# Patient Record
Sex: Female | Born: 1990
Health system: Southern US, Community
[De-identification: ages and names within clinical notes are randomized; demographics above are authoritative.]

## PROBLEM LIST (undated history)

## (undated) HISTORY — PX: TONSILLECTOMY: SUR1361

---
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
## 2012-03-26 ENCOUNTER — Ambulatory Visit: Payer: Self-pay | Admitting: Neurology

## 2012-11-27 DIAGNOSIS — D500 Iron deficiency anemia secondary to blood loss (chronic): Secondary | ICD-10-CM | POA: Insufficient documentation

## 2012-11-27 HISTORY — DX: Iron deficiency anemia secondary to blood loss (chronic): D50.0

## 2014-06-09 DIAGNOSIS — G44219 Episodic tension-type headache, not intractable: Secondary | ICD-10-CM | POA: Insufficient documentation

## 2014-06-09 HISTORY — DX: Episodic tension-type headache, not intractable: G44.219

## 2015-07-28 ENCOUNTER — Ambulatory Visit (INDEPENDENT_AMBULATORY_CARE_PROVIDER_SITE_OTHER): Payer: BLUE CROSS/BLUE SHIELD | Admitting: Podiatry

## 2015-07-28 ENCOUNTER — Encounter: Payer: Self-pay | Admitting: Podiatry

## 2015-07-28 ENCOUNTER — Ambulatory Visit (INDEPENDENT_AMBULATORY_CARE_PROVIDER_SITE_OTHER): Payer: BLUE CROSS/BLUE SHIELD

## 2015-07-28 VITALS — BP 109/79 | HR 88 | Resp 16 | Ht 67.0 in | Wt 185.0 lb

## 2015-07-28 DIAGNOSIS — M79671 Pain in right foot: Secondary | ICD-10-CM

## 2015-07-28 DIAGNOSIS — M79672 Pain in left foot: Secondary | ICD-10-CM

## 2015-07-28 DIAGNOSIS — Q828 Other specified congenital malformations of skin: Secondary | ICD-10-CM | POA: Diagnosis not present

## 2015-07-28 DIAGNOSIS — M216X9 Other acquired deformities of unspecified foot: Secondary | ICD-10-CM

## 2015-07-28 DIAGNOSIS — M722 Plantar fascial fibromatosis: Secondary | ICD-10-CM | POA: Diagnosis not present

## 2015-07-28 NOTE — Patient Instructions (Signed)

## 2015-07-28 NOTE — Progress Notes (Signed)
   Subjective:    Patient ID: Sonia Webb, female    DOB: [birth year], 25 y.o.   MRN: [id number]  HPI Chief Complaint  Patient presents with  . Foot Pain    Bilateral; heel & arch; pt stated, "foot hurts more at night"; x2 months  . Painful lesion    Left foot; plantar forefoot-below great toe   25 year old female presents the office today for concerns of bilateral heel pain which is been ongoing about 2 months. She states that she has pain in the morning when she gets up and after standing all day. She denies any recent treatment. Denies any recent injury or trauma. No swelling or redness. No numbness or tingling. Pain does not wake her at night. She also has a callus in the left foot in the ball of her foot which is painful at times. Denies any drainage or redness. No other complete a this time and no other concerns.   Review of Systems  All other systems reviewed and are negative.      Objective:   Physical Exam General: AAO x3, NAD  Dermatological: Hyperkeratotic lesion left foot some metatarsal 1. No underlying ulceration, drainage or other signs of infection. No other open lesions or pre-ulcerative lesions are identified at this time.  Vascular: Dorsalis Pedis artery and Posterior Tibial artery pedal pulses are 2/4 bilateral with immedate capillary fill time. Pedal hair growth present. No varicosities and no lower extremity edema present bilateral. There is no pain with calf compression, swelling, warmth, erythema.   Neruologic: Grossly intact via light touch bilateral. Vibratory intact via tuning fork bilateral. Protective threshold with Semmes Wienstein monofilament intact to all pedal sites bilateral.   Musculoskeletal: Tenderness to palpation along the plantar medial tubercle of the calcaneus at the insertion of plantar fascia on the left and right foot. There is no pain along the course of the plantar fascia within the arch of the foot. Plantar fascia appears to be  intact. There is no pain with lateral compression of the calcaneus or pain with vibratory sensation. There is no pain along the course or insertion of the achilles tendon. No other areas of tenderness to bilateral lower extremities. Cavus foot type is present.  Gait: Unassisted, Nonantalgic.       Assessment & Plan:  25 year old female bilateral heel pain,plantar fasciitis due to biomechanical changes; left foot hyperkeratotic lesion -Treatment options discussed including all alternatives, risks, and complications -Etiology of symptoms were discussed -X-rays were obtained and reviewed with the patient. No evidence of acute fractures identified. -Patient elects to proceed with steroid injection into the left and right heel. Under sterile skin preparation, a total of 2.5cc of kenalog 10, 0.5% Marcaine plain, and 2% lidocaine plain were infiltrated into the symptomatic area without complication. A band-aid was applied. Patient tolerated the injection well without complication. Post-injection care with discussed with the patient. Discussed with the patient to ice the area over the next couple of days to help prevent a steroid flare.  -Plantar fascial braces -She cannot take anti-inflammatories -Continue stretching, icing exercises daily. -Given cavus foot type with likely and affect from orthotics. However given insurance she'll start with over-the-counter. -Discussed shoe gear modifications -Hyperkeratotic lesion debrided 1 without complications or bleeding -Follow-up in 3 weeks or sooner if any problems arise. In the meantime, encouraged to call the office with any questions, concerns, change in symptoms.   Celesta Gentile, DPM

## 2015-08-25 ENCOUNTER — Ambulatory Visit: Payer: BLUE CROSS/BLUE SHIELD | Admitting: Podiatry

## 2015-09-13 ENCOUNTER — Encounter: Payer: Self-pay | Admitting: Podiatry

## 2015-09-13 ENCOUNTER — Ambulatory Visit (INDEPENDENT_AMBULATORY_CARE_PROVIDER_SITE_OTHER): Payer: BLUE CROSS/BLUE SHIELD | Admitting: Podiatry

## 2015-09-13 DIAGNOSIS — M722 Plantar fascial fibromatosis: Secondary | ICD-10-CM | POA: Diagnosis not present

## 2015-09-13 DIAGNOSIS — B078 Other viral warts: Secondary | ICD-10-CM

## 2015-09-13 DIAGNOSIS — B079 Viral wart, unspecified: Secondary | ICD-10-CM | POA: Diagnosis not present

## 2015-09-13 NOTE — Progress Notes (Signed)
Subjective: BLAIZE KIGER presents to the office today for follow-up evaluation of bilateral heel pain. She states that she's having no pain in the left side but she still been some pain of the right foot all does have improved. She's been stretching icing daily. The plantar fascial braces seem to be too tight. She has changed shoes since last appointment as well. She states the lesion to the bottom of her left big toe continues with this as improved as well. Denies any drainage or redness or any swelling.  No other complaints at this time. No acute changes since last appointment. They deny any systemic complaints such as fevers, chills, nausea, vomiting.  Objective: General: AAO x3, NAD  Dermatological: The plantar aspect of the left foot is a hyperkeratotic lesion. Upon debridement there is pinpoint bleeding evidence of verruca. No underlying ulceration, drainage or other signs of infection. No foreign body. No other open lesions or pre-ulcerative lesions. No other evidence of verruca present at this time.  Vascular: Dorsalis Pedis artery and Posterior Tibial artery pedal pulses are 2/4 bilateral with immedate capillary fill time. There is no pain with calf compression, swelling, warmth, erythema.   Neruologic: Grossly intact via light touch bilateral. Vibratory intact via tuning fork bilateral. Protective threshold with Semmes Wienstein monofilament intact to all pedal sites bilateral.   Musculoskeletal: There is decreased but continued tenderness palpation along the plantar medial tubercle of the calcaneus at the insertion of the plantar fascia on the right foot. There is no pain to the left. There is no pain along the course of the plantar fascia within the arch of the foot. Plantar fascia appears to be intact bilaterally. There is no pain with lateral compression of the calcaneus and there is no pain with vibratory sensation. There is no pain along the course or insertion of the Achilles  tendon. There are no other areas of tenderness to bilateral lower extremities. No gross boney pedal deformities bilateral. No pain, crepitus, or limitation noted with foot and ankle range of motion bilateral. Muscular strength 5/5 in all groups tested bilateral.  Gait: Unassisted, Nonantalgic.   Assessment: Presents for follow-up evaluation for heel pain, likely plantar fasciitis   Plan: -Treatment options discussed including all alternatives, risks, and complications -Patient elects to proceed with steroid injection into the right heel. Under sterile skin preparation, a total of 2.5cc of kenalog 10, 0.5% Marcaine plain, and 2% lidocaine plain were infiltrated into the symptomatic area without complication. A band-aid was applied. Patient tolerated the injection well without complication. Post-injection care with discussed with the patient. Discussed with the patient to ice the area over the next couple of days to help prevent a steroid flare. He stretching, icing exercises daily. Continue supportive shoe gear. -Hyperkeratotic lesion/for a debrided left foot. Areas clean and hemostasis achieved. Cantharone was applied followed occlusive bandage. Procedure instructions were discussed. Monitor for infection. -Follow-up in 4 weeks if symptoms continue or sooner if any problems arise. In the meantime, encouraged to call the office with any questions, concerns, change in symptoms.   Celesta Gentile, DPM

## 2016-06-15 ENCOUNTER — Ambulatory Visit (INDEPENDENT_AMBULATORY_CARE_PROVIDER_SITE_OTHER): Payer: BLUE CROSS/BLUE SHIELD | Admitting: Podiatry

## 2016-06-15 ENCOUNTER — Encounter: Payer: Self-pay | Admitting: Podiatry

## 2016-06-15 DIAGNOSIS — B070 Plantar wart: Secondary | ICD-10-CM

## 2016-06-17 NOTE — Progress Notes (Signed)
   Subjective: Patient presents today with intermittent pain and tenderness on the left plantar forefoot beneath the great toe secondary to a plantar wart. Patient states that the pain has been present for the past 10 months. Walking makes the pain worse. Patient denies trauma.  Objective: Physical Exam General: The patient is alert and oriented x3 in no acute distress.  Dermatology: Hyperkeratotic skin lesion noted to the plantar aspect of the left foot approximately 1 cm in diameter. Pinpoint bleeding noted upon debridement. Skin is warm, dry and supple bilateral lower extremities. Negative for open lesions or macerations.  Vascular: Palpable pedal pulses bilaterally. No edema or erythema noted. Capillary refill within normal limits.  Neurological: Epicritic and protective threshold grossly intact bilaterally.   Musculoskeletal Exam: Pain on palpation to the note skin lesion.  Range of motion within normal limits to all pedal and ankle joints bilateral. Muscle strength 5/5 in all groups bilateral.   Assessment: #1 plantar wart left plantar forefoot #2 pain in left foot   Plan of Care:  #1 Patient was evaluated. #2 Excisional debridement of the plantar wart lesion was performed using a chisel blade. Cantharone was applied and the lesion was dressed with a dry sterile dressing. #3 patient is to return to clinic in 2 weeks.   Edrick Kins, DPM [facility]  Dr. Edrick Kins, [address]                                        [place], [place] [address]                Office [phone]  Fax [phone]

## 2016-07-06 ENCOUNTER — Ambulatory Visit (INDEPENDENT_AMBULATORY_CARE_PROVIDER_SITE_OTHER): Payer: BLUE CROSS/BLUE SHIELD | Admitting: Podiatry

## 2016-07-06 DIAGNOSIS — B070 Plantar wart: Secondary | ICD-10-CM | POA: Diagnosis not present

## 2016-07-10 NOTE — Progress Notes (Signed)
   Subjective: Patient presents today for follow-up evaluation of a plantar wart of the left forefoot. She states her pain has improved that she still has a spot on the left forefoot. She reports pain when wearing flat shoes. She denies any new complaints at this time.  Objective: Physical Exam General: The patient is alert and oriented x3 in no acute distress.  Dermatology: Hyperkeratotic skin lesion noted to the plantar aspect of the left foot approximately 1 cm in diameter. Pinpoint bleeding noted upon debridement. Skin is warm, dry and supple bilateral lower extremities. Negative for open lesions or macerations.  Vascular: Palpable pedal pulses bilaterally. No edema or erythema noted. Capillary refill within normal limits.  Neurological: Epicritic and protective threshold grossly intact bilaterally.   Musculoskeletal Exam: Pain on palpation to the note skin lesion.  Range of motion within normal limits to all pedal and ankle joints bilateral. Muscle strength 5/5 in all groups bilateral.   Assessment: #1 plantar wart left plantar forefoot #2 pain in left foot   Plan of Care:  #1 Patient was evaluated. #2 Excisional debridement of the plantar wart lesion was performed using a chisel blade. Dressed with a dry sterile dressing. #3 patient is to return to clinic when necessary.   Edrick Kins, DPM [facility]  Dr. Edrick Kins, [address]                                        [place], [place] [address]                Office [phone]  Fax [phone]

## 2016-10-16 ENCOUNTER — Ambulatory Visit (INDEPENDENT_AMBULATORY_CARE_PROVIDER_SITE_OTHER): Payer: BLUE CROSS/BLUE SHIELD | Admitting: Podiatry

## 2016-10-16 ENCOUNTER — Ambulatory Visit: Payer: BLUE CROSS/BLUE SHIELD

## 2016-10-16 DIAGNOSIS — M722 Plantar fascial fibromatosis: Secondary | ICD-10-CM

## 2016-10-16 DIAGNOSIS — M7661 Achilles tendinitis, right leg: Secondary | ICD-10-CM | POA: Diagnosis not present

## 2016-10-17 NOTE — Progress Notes (Signed)
   Subjective: 26 year old female presents today for constant throbbing pain and tenderness in the heels and arches bilaterally that is been ongoing for the past year. She reports associated pressure to the areas. Standing increases the pain. She has been stretching and elevating the feet for treatment. Patient presents today for further treatment and evaluation.   No past medical history on file.   Objective: Physical Exam General: The patient is alert and oriented x3 in no acute distress.  Dermatology: Skin is warm, dry and supple bilateral lower extremities. Negative for open lesions or macerations bilateral.   Vascular: Dorsalis Pedis and Posterior Tibial pulses palpable bilateral.  Capillary fill time is immediate to all digits.  Neurological: Epicritic and protective threshold intact bilateral.   Musculoskeletal: Tenderness to palpation at the medial calcaneal tubercale and through the insertion of the plantar fascia of the bilateral feet. All other joints range of motion within normal limits bilateral. Pain on palpation noted to the posterior tubercle of the right calcaneus at the insertion of the Achilles tendon consistent with retrocalcaneal bursitis.Range of motion within normal limits. Muscle strength 5/5 in all muscle groups bilateral lower extremities.   Assessment: 1. plantar fasciitis bilateral feet 2. Achilles tendinitis right  Plan of Care:  1. Patient evaluated. 2. Injection of 0.5cc Celestone soluspan injected into the bilateral heels.  3. Cannot tolerate NSAIDs. Patient has bleeding disorder. 4. Prescription for pain cream to be dispensed from [facility]. 5. Silicone heel/Achilles sleeve dispensed. 6. Return to clinic in 4 weeks.     Edrick Kins, DPM [facility]  Dr. Edrick Kins, DPM    [address], [place] [address]                Office [phone]  Fax [phone]

## 2016-10-24 MED ORDER — BETAMETHASONE SOD PHOS & ACET 6 (3-3) MG/ML IJ SUSP: 3.0000 mg | Freq: Once | INTRAMUSCULAR | Status: AC

## 2016-11-13 ENCOUNTER — Ambulatory Visit (INDEPENDENT_AMBULATORY_CARE_PROVIDER_SITE_OTHER): Payer: BLUE CROSS/BLUE SHIELD | Admitting: Podiatry

## 2016-11-13 DIAGNOSIS — M7661 Achilles tendinitis, right leg: Secondary | ICD-10-CM

## 2016-11-13 DIAGNOSIS — M722 Plantar fascial fibromatosis: Secondary | ICD-10-CM

## 2016-11-13 MED ORDER — BETAMETHASONE SOD PHOS & ACET 6 (3-3) MG/ML IJ SUSP: 3.0000 mg | Freq: Once | INTRAMUSCULAR | Status: AC

## 2016-11-13 MED ORDER — NONFORMULARY OR COMPOUNDED ITEM: 2 refills | Status: DC

## 2016-11-15 NOTE — Progress Notes (Signed)
   Subjective: 26 year old female presents today for follow up evaluation of bilateral plantar fasciitis and right achilles tendinitis. She states her left foot pain has improved. She reports continued pain in the right heel. She states she could not wear the Achilles brace. Patient presents today for further treatment and evaluation.   No past medical history on file.   Objective: Physical Exam General: The patient is alert and oriented x3 in no acute distress.  Dermatology: Skin is warm, dry and supple bilateral lower extremities. Negative for open lesions or macerations bilateral.   Vascular: Dorsalis Pedis and Posterior Tibial pulses palpable bilateral.  Capillary fill time is immediate to all digits.  Neurological: Epicritic and protective threshold intact bilateral.   Musculoskeletal: Tenderness to palpation at the medial calcaneal tubercale and through the insertion of the plantar fascia of the right foot. All other joints range of motion within normal limits bilateral. Pain on palpation noted to the posterior tubercle of the right calcaneus at the insertion of the Achilles tendon consistent with retrocalcaneal bursitis.Range of motion within normal limits. Muscle strength 5/5 in all muscle groups bilateral lower extremities.   Assessment: 1. plantar fasciitis right foot 2. Achilles tendinitis right  Plan of Care:  1. Patient evaluated. 2. Injection of 0.5cc Celestone soluspan injected into the right heel. 3. Injection of 0.5 mLs of Celestone Soluspan injected into the retrocalcaneal bursa. Care was taken to avoid direct injection into the tendon.  4. Appointment with [facility] for custom molded orthotics. 5. Follow-up with [facility] for pain cream.   6. Return to clinic in 4 weeks.   Edrick Kins, DPM [facility]  Dr. Edrick Kins, DPM    [address], [place] [address]                Office [phone]  Fax [phone]

## 2016-12-05 ENCOUNTER — Ambulatory Visit (INDEPENDENT_AMBULATORY_CARE_PROVIDER_SITE_OTHER): Payer: BLUE CROSS/BLUE SHIELD | Admitting: Orthotics

## 2016-12-05 DIAGNOSIS — M7661 Achilles tendinitis, right leg: Secondary | ICD-10-CM

## 2016-12-05 DIAGNOSIS — M722 Plantar fascial fibromatosis: Secondary | ICD-10-CM

## 2016-12-10 NOTE — Progress Notes (Signed)
Patient here today to be evaluated/cast for CMFO to address achilles tendonitis R.  Plan on Sonia Webb fab a device with 1/4" heel post to be reduced to 1/8 as equinus getts better by stretcing.

## 2016-12-11 ENCOUNTER — Encounter: Payer: Self-pay | Admitting: Podiatry

## 2016-12-11 ENCOUNTER — Ambulatory Visit (INDEPENDENT_AMBULATORY_CARE_PROVIDER_SITE_OTHER): Payer: BLUE CROSS/BLUE SHIELD | Admitting: Podiatry

## 2016-12-11 DIAGNOSIS — M722 Plantar fascial fibromatosis: Secondary | ICD-10-CM | POA: Diagnosis not present

## 2016-12-11 DIAGNOSIS — M7661 Achilles tendinitis, right leg: Secondary | ICD-10-CM | POA: Diagnosis not present

## 2016-12-13 NOTE — Progress Notes (Signed)
   Subjective: 26 year old female presents today for follow up evaluation of right plantar fasciitis and Achilles tendinitis. She states the achilles is hurting worse than the heel. She states the injection and the pain cream provided no relief of the pain. Walking and standing increase the pain. She denies alleviating factors. Patient presents today for further treatment and evaluation.   No past medical history on file.   Objective: Physical Exam General: The patient is alert and oriented x3 in no acute distress.  Dermatology: Skin is warm, dry and supple bilateral lower extremities. Negative for open lesions or macerations bilateral.   Vascular: Dorsalis Pedis and Posterior Tibial pulses palpable bilateral.  Capillary fill time is immediate to all digits.  Neurological: Epicritic and protective threshold intact bilateral.   Musculoskeletal: Pain on palpation noted to the posterior tubercle of the right calcaneus at the insertion of the Achilles tendon consistent with retrocalcaneal bursitis. Range of motion within normal limits. Muscle strength 5/5 in all muscle groups bilateral lower extremities.   Assessment: 1. plantar fasciitis right foot - resolved 2. Achilles tendinitis right  Plan of Care:  1. Patient evaluated. 2. Scheduled to pick up custom molded orthotics on 01/01/17. 3. Achilles silicone sleeve dispensed.  4. Stressed importance of stretching exercises.  5. Cannot tolerate oral NSAIDs. Continue using topical pain cream from [facility] [facility].  6. Return to clinic in 6 weeks.  Edrick Kins, DPM [facility]  Dr. Edrick Kins, DPM    [address], [place] [address]                Office [phone]  Fax [phone]

## 2016-12-28 DIAGNOSIS — M722 Plantar fascial fibromatosis: Secondary | ICD-10-CM | POA: Diagnosis not present

## 2016-12-29 ENCOUNTER — Ambulatory Visit (INDEPENDENT_AMBULATORY_CARE_PROVIDER_SITE_OTHER): Payer: BLUE CROSS/BLUE SHIELD | Admitting: Orthotics

## 2016-12-29 DIAGNOSIS — M7661 Achilles tendinitis, right leg: Secondary | ICD-10-CM

## 2016-12-29 NOTE — Progress Notes (Signed)
Patient came in today to pick up custom made foot orthotics.  The goals were accomplished and the patient reported no dissatisfaction with said orthotics.  Patient was advised of breakin period and how to report any issues.  There was a struggle to get a proper fit in her current footwear ([employer]); said she will try to fit in [place] when she gets home.

## 2017-01-01 ENCOUNTER — Other Ambulatory Visit: Payer: BLUE CROSS/BLUE SHIELD | Admitting: Orthotics

## 2017-01-22 ENCOUNTER — Ambulatory Visit (INDEPENDENT_AMBULATORY_CARE_PROVIDER_SITE_OTHER): Payer: BLUE CROSS/BLUE SHIELD | Admitting: Podiatry

## 2017-01-22 ENCOUNTER — Encounter: Payer: Self-pay | Admitting: Podiatry

## 2017-01-22 DIAGNOSIS — M7661 Achilles tendinitis, right leg: Secondary | ICD-10-CM | POA: Diagnosis not present

## 2017-01-24 NOTE — Progress Notes (Signed)
   HPI: 27 year old female presenting today for follow up evaluation of achilles tendinitis of the RLE. She reports continued pain but states she has not been on her feet as much. She has been wearing the orthotics and reports she is still breaking them in. She has been using the topical pain cream with no significant relief. Patient is here for further evaluation and treatment.   No past medical history on file.    Physical Exam: General: The patient is alert and oriented x3 in no acute distress.  Dermatology: Skin is warm, dry and supple bilateral lower extremities. Negative for open lesions or macerations.  Vascular: Palpable pedal pulses bilaterally. No edema or erythema noted. Capillary refill within normal limits.  Neurological: Epicritic and protective threshold grossly intact bilaterally.   Musculoskeletal Exam: Pain on palpation noted to the posterior tubercle of the right calcaneus at the insertion of the Achilles tendon consistent with retrocalcaneal bursitis. Range of motion within normal limits. Muscle strength 5/5 in all muscle groups bilateral lower extremities.   Assessment: 1. Insertional Achilles tendinitis right   Plan of Care:  1. Patient was evaluated.  2. Injection of 0.5 mL Celestone Soluspan injected into the retrocalcaneal bursa. Care was taken to avoid direct injection into the Achilles tendon. 3. Continue wearing custom molded orthotics.  4. Continue stretching exercises.  5. Return to clinic as needed.    Edrick Kins, DPM [facility]  Dr. Edrick Kins, [address]                                        [place], [place] [address]                Office [phone]  Fax [phone]

## 2017-06-13 ENCOUNTER — Other Ambulatory Visit: Payer: Self-pay | Admitting: Orthopedic Surgery

## 2017-06-13 DIAGNOSIS — M79605 Pain in left leg: Secondary | ICD-10-CM

## 2017-06-20 ENCOUNTER — Ambulatory Visit
Admission: RE | Admit: 2017-06-20 | Discharge: 2017-06-20 | Disposition: A | Discharge status: Home / Self Care | Payer: BLUE CROSS/BLUE SHIELD | Source: Ambulatory Visit | Attending: Orthopedic Surgery | Admitting: Orthopedic Surgery

## 2017-06-20 DIAGNOSIS — M79605 Pain in left leg: Secondary | ICD-10-CM | POA: Diagnosis present

## 2017-06-20 DIAGNOSIS — L988 Other specified disorders of the skin and subcutaneous tissue: Secondary | ICD-10-CM | POA: Diagnosis not present

## 2017-06-20 MED ORDER — GADOBENATE DIMEGLUMINE 529 MG/ML IV SOLN
17.0000 mL | Freq: Once | INTRAVENOUS | Status: AC | PRN
  Administered 2017-06-20: 17 mL via INTRAVENOUS

## 2017-09-13 DIAGNOSIS — K219 Gastro-esophageal reflux disease without esophagitis: Secondary | ICD-10-CM | POA: Diagnosis not present

## 2017-10-11 ENCOUNTER — Ambulatory Visit: Payer: BLUE CROSS/BLUE SHIELD | Admitting: Podiatry

## 2017-10-11 ENCOUNTER — Encounter: Payer: Self-pay | Admitting: Podiatry

## 2017-10-11 DIAGNOSIS — B070 Plantar wart: Secondary | ICD-10-CM

## 2017-10-11 MED ORDER — TRAMADOL HCL 50 MG PO TABS: 50.0000 mg | ORAL_TABLET | Freq: Three times a day (TID) | ORAL | 0 refills | Status: DC | PRN

## 2017-10-13 NOTE — Progress Notes (Signed)
   Subjective: Patient presents today with a new complaint regarding pain and tenderness on the plantar aspect of the left foot secondary to a plantars wart. Patient states that the pain has been present for several weeks now. Patient denies trauma.  Patient has tried over-the-counter medications that have not alleviated symptoms.  Patient does have a history of Achilles tendinitis the right lower extremity however she is currently nonsymptomatic.  No past medical history on file.  Objective: Physical Exam General: The patient is alert and oriented x3 in no acute distress.  Dermatology: Hyperkeratotic skin lesion noted to the plantar aspect of the left foot approximately 1 cm in diameter. Pinpoint bleeding noted upon debridement. Skin is warm, dry and supple bilateral lower extremities. Negative for open lesions or macerations.  Vascular: Palpable pedal pulses bilaterally. No edema or erythema noted. Capillary refill within normal limits.  Neurological: Epicritic and protective threshold grossly intact bilaterally.   Musculoskeletal Exam: Pain on palpation to the note skin lesion.  Range of motion within normal limits to all pedal and ankle joints bilateral. Muscle strength 5/5 in all groups bilateral.   Assessment: #1 plantar wart left foot #2 pain in left foot   Plan of Care:  #1 Patient was evaluated. #2 Excisional debridement of the plantar wart lesion was performed using a chisel blade. Cantharone was applied and the lesion was dressed with a dry sterile dressing. #3  Prescription for tramadol 50 mg #30  #4 patient is to return to clinic in 2 weeks  Edrick Kins, DPM [facility]  Dr. Edrick Kins, [address]                                        [place], [place] [address]                Office [phone]  Fax [phone]

## 2017-11-01 ENCOUNTER — Encounter: Payer: Self-pay | Admitting: Podiatry

## 2017-11-01 ENCOUNTER — Ambulatory Visit: Payer: 59 | Admitting: Podiatry

## 2017-11-01 DIAGNOSIS — B070 Plantar wart: Secondary | ICD-10-CM

## 2017-11-01 MED ORDER — GENTAMICIN SULFATE 0.1 % EX CREA: 1.0000 "application " | TOPICAL_CREAM | Freq: Two times a day (BID) | CUTANEOUS | 1 refills | Status: DC

## 2017-11-06 NOTE — Progress Notes (Signed)
   Subjective: 27 year old female presents today for follow-up evaluation and treatment of a plantar wart to the left great toe.  Last appointment the wart lesion was debrided and application of Cantharone was applied.  Patient did not notice a significant amount of improvement.  She would like to have the wart excisionally removed today.  No past medical history on file.  Objective: Physical Exam General: The patient is alert and oriented x3 in no acute distress.  Dermatology: Hyperkeratotic skin lesion noted to the plantar aspect of the left foot approximately 1 cm in diameter. Pinpoint bleeding noted upon debridement. Skin is warm, dry and supple bilateral lower extremities. Negative for open lesions or macerations.  Vascular: Palpable pedal pulses bilaterally. No edema or erythema noted. Capillary refill within normal limits.  Neurological: Epicritic and protective threshold grossly intact bilaterally.   Musculoskeletal Exam: Pain on palpation to the note skin lesion.  Range of motion within normal limits to all pedal and ankle joints bilateral. Muscle strength 5/5 in all groups bilateral.   Assessment: #1 plantar wart left foot #2 pain in left foot   Plan of Care:  #1 Patient was evaluated. #2  Today the decision was made to surgically excise the plantar wart.  Prior to the procedure the foot was prepped in aseptic manner and 3 mL of 2% lidocaine plain was utilized for anesthesia. #3 the wart lesion was circumscribed using a surgical #11 blade and large curette was utilized to scoop out the wart lesion.  Dry sterile dressing was applied. #4 recommend dry sterile dressing daily. #5 return to clinic in 2 weeks  Edrick Kins, DPM [facility]  Dr. Edrick Kins, [address]                                        [place], [place] [address]                Office [phone]  Fax [phone]

## 2017-11-11 ENCOUNTER — Other Ambulatory Visit: Payer: Self-pay

## 2017-11-11 DIAGNOSIS — K219 Gastro-esophageal reflux disease without esophagitis: Secondary | ICD-10-CM | POA: Diagnosis not present

## 2017-11-11 DIAGNOSIS — B070 Plantar wart: Secondary | ICD-10-CM

## 2017-11-22 ENCOUNTER — Encounter: Payer: Self-pay | Admitting: Podiatry

## 2017-11-22 ENCOUNTER — Ambulatory Visit: Payer: 59 | Admitting: Podiatry

## 2017-11-22 DIAGNOSIS — B070 Plantar wart: Secondary | ICD-10-CM

## 2017-11-24 NOTE — Progress Notes (Signed)
   Subjective: 27 year old female presenting today for follow up evaluation of a plantar wart noted to the left foot. She states the area is improving. She has been applying a dry sterile bandage daily. There are no modifying factors noted. Patient is here for further evaluation and treatment.   No past medical history on file.   Objective: Physical Exam General: The patient is alert and oriented x3 in no acute distress.  Dermatology: Hyperkeratotic skin lesion noted to the plantar aspect of the left foot approximately 1 cm in diameter. Pinpoint bleeding noted upon debridement. Skin is warm, dry and supple bilateral lower extremities. Negative for open lesions or macerations.  Vascular: Palpable pedal pulses bilaterally. No edema or erythema noted. Capillary refill within normal limits.  Neurological: Epicritic and protective threshold grossly intact bilaterally.   Musculoskeletal Exam: Pain on palpation to the note skin lesion.  Range of motion within normal limits to all pedal and ankle joints bilateral. Muscle strength 5/5 in all groups bilateral.   Assessment: #1 plantar wart left foot - resolved    Plan of Care:  #1 Patient was evaluated. #2 Light debridement of area performed with a chisel blade.  #3 Recommended antibiotic ointment daily with a bandage for one week.  #4 Return to clinic as needed.    Edrick Kins, DPM [facility]  Dr. Edrick Kins, [place] [address]                                        [place], [place] [address]                Office [phone]  Fax [phone]

## 2017-11-25 DIAGNOSIS — D649 Anemia, unspecified: Secondary | ICD-10-CM | POA: Diagnosis not present

## 2018-06-13 ENCOUNTER — Ambulatory Visit: Payer: 59 | Admitting: Podiatry

## 2018-06-13 ENCOUNTER — Encounter: Payer: Self-pay | Admitting: Podiatry

## 2018-06-13 ENCOUNTER — Other Ambulatory Visit: Payer: Self-pay

## 2018-06-13 VITALS — Temp 98.2°F

## 2018-06-13 DIAGNOSIS — B070 Plantar wart: Secondary | ICD-10-CM | POA: Diagnosis not present

## 2018-06-16 NOTE — Progress Notes (Signed)
   Subjective: 28 year old female presenting today with a chief complaint of painful lesions on the plantar aspect of the left foot that reappeared about 2-3 months ago. She states she has had plantar warts on the foot in the past and believes they have come back. She reports soreness that is worsened with weightbearing and ambulation. She has not done anything at home for treatment. Patient is here for further evaluation and treatment.    No past medical history on file.  Objective: Physical Exam General: The patient is alert and oriented x3 in no acute distress.   Dermatology: Hyperkeratotic skin lesions noted to the plantar aspect of the left foot approximately 1 cm in diameter. Pinpoint bleeding noted upon debridement. Skin is warm, dry and supple bilateral lower extremities. Negative for open lesions or macerations.   Vascular: Palpable pedal pulses bilaterally. No edema or erythema noted. Capillary refill within normal limits.   Neurological: Epicritic and protective threshold grossly intact bilaterally.    Musculoskeletal Exam: Pain on palpation to the noted skin lesions.  Range of motion within normal limits to all pedal and ankle joints bilateral. Muscle strength 5/5 in all groups bilateral.    Assessment: #1 plantar wart left foot x 2   Plan of Care:  #1 Patient was evaluated. #2 Excisional debridement of the plantar wart lesion was performed using a surgical blade and curettage. Prior to procedure, 3 mLs of Lidocaine 2% with Epinephrine used and foot was prepped in an aseptic manner. Dry sterile dressing placed.  #3 Recommended OTC wart remover beginning in one week.  #4 Patient is to return to clinic as needed.   Edrick Kins, DPM [facility]  Dr. Edrick Kins, [address]                                        [place], [place] [address]                Office [phone]  Fax [phone]

## 2018-07-22 DIAGNOSIS — Z0000 Encounter for general adult medical examination without abnormal findings: Secondary | ICD-10-CM | POA: Diagnosis not present

## 2018-07-22 DIAGNOSIS — Z833 Family history of diabetes mellitus: Secondary | ICD-10-CM | POA: Insufficient documentation

## 2018-07-22 DIAGNOSIS — D68021 Von Willebrand disease, type 2b: Secondary | ICD-10-CM

## 2018-07-22 DIAGNOSIS — D6800 Von Willebrand disease, unspecified: Secondary | ICD-10-CM | POA: Insufficient documentation

## 2018-07-22 DIAGNOSIS — K219 Gastro-esophageal reflux disease without esophagitis: Secondary | ICD-10-CM | POA: Diagnosis not present

## 2018-07-22 DIAGNOSIS — D500 Iron deficiency anemia secondary to blood loss (chronic): Secondary | ICD-10-CM | POA: Diagnosis not present

## 2018-07-22 HISTORY — DX: Von Willebrand's disease: D68.0

## 2018-07-22 HISTORY — DX: Family history of diabetes mellitus: Z83.3

## 2018-07-22 HISTORY — DX: Gastro-esophageal reflux disease without esophagitis: K21.9

## 2018-07-22 HISTORY — DX: Von Willebrand disease, type 2b: D68.021

## 2018-07-25 DIAGNOSIS — E538 Deficiency of other specified B group vitamins: Secondary | ICD-10-CM | POA: Diagnosis not present

## 2018-11-05 DIAGNOSIS — D2261 Melanocytic nevi of right upper limb, including shoulder: Secondary | ICD-10-CM | POA: Diagnosis not present

## 2018-11-05 DIAGNOSIS — Z808 Family history of malignant neoplasm of other organs or systems: Secondary | ICD-10-CM | POA: Diagnosis not present

## 2018-11-05 DIAGNOSIS — B078 Other viral warts: Secondary | ICD-10-CM | POA: Diagnosis not present

## 2018-12-04 DIAGNOSIS — B078 Other viral warts: Secondary | ICD-10-CM | POA: Diagnosis not present

## 2018-12-25 DIAGNOSIS — B079 Viral wart, unspecified: Secondary | ICD-10-CM | POA: Diagnosis not present

## 2019-01-22 DIAGNOSIS — E538 Deficiency of other specified B group vitamins: Secondary | ICD-10-CM | POA: Diagnosis not present

## 2019-01-22 DIAGNOSIS — D500 Iron deficiency anemia secondary to blood loss (chronic): Secondary | ICD-10-CM | POA: Diagnosis not present

## 2019-01-28 DIAGNOSIS — B079 Viral wart, unspecified: Secondary | ICD-10-CM | POA: Diagnosis not present

## 2019-02-18 DIAGNOSIS — B079 Viral wart, unspecified: Secondary | ICD-10-CM | POA: Diagnosis not present

## 2019-07-24 DIAGNOSIS — D510 Vitamin B12 deficiency anemia due to intrinsic factor deficiency: Secondary | ICD-10-CM | POA: Insufficient documentation

## 2019-07-24 DIAGNOSIS — D500 Iron deficiency anemia secondary to blood loss (chronic): Secondary | ICD-10-CM | POA: Diagnosis not present

## 2019-07-24 DIAGNOSIS — Z0000 Encounter for general adult medical examination without abnormal findings: Secondary | ICD-10-CM | POA: Diagnosis not present

## 2019-07-24 DIAGNOSIS — Z131 Encounter for screening for diabetes mellitus: Secondary | ICD-10-CM | POA: Diagnosis not present

## 2019-07-24 HISTORY — DX: Vitamin B12 deficiency anemia due to intrinsic factor deficiency: D51.0

## 2019-11-02 DIAGNOSIS — M9902 Segmental and somatic dysfunction of thoracic region: Secondary | ICD-10-CM | POA: Diagnosis not present

## 2019-11-02 DIAGNOSIS — M62830 Muscle spasm of back: Secondary | ICD-10-CM | POA: Diagnosis not present

## 2019-11-02 DIAGNOSIS — M9903 Segmental and somatic dysfunction of lumbar region: Secondary | ICD-10-CM | POA: Diagnosis not present

## 2019-11-02 DIAGNOSIS — R293 Abnormal posture: Secondary | ICD-10-CM | POA: Diagnosis not present

## 2019-11-04 DIAGNOSIS — M9902 Segmental and somatic dysfunction of thoracic region: Secondary | ICD-10-CM | POA: Diagnosis not present

## 2019-11-04 DIAGNOSIS — M9903 Segmental and somatic dysfunction of lumbar region: Secondary | ICD-10-CM | POA: Diagnosis not present

## 2019-11-04 DIAGNOSIS — M62830 Muscle spasm of back: Secondary | ICD-10-CM | POA: Diagnosis not present

## 2019-11-04 DIAGNOSIS — R293 Abnormal posture: Secondary | ICD-10-CM | POA: Diagnosis not present

## 2019-11-11 DIAGNOSIS — M62830 Muscle spasm of back: Secondary | ICD-10-CM | POA: Diagnosis not present

## 2019-11-11 DIAGNOSIS — R293 Abnormal posture: Secondary | ICD-10-CM | POA: Diagnosis not present

## 2019-11-11 DIAGNOSIS — M9902 Segmental and somatic dysfunction of thoracic region: Secondary | ICD-10-CM | POA: Diagnosis not present

## 2019-11-11 DIAGNOSIS — M9903 Segmental and somatic dysfunction of lumbar region: Secondary | ICD-10-CM | POA: Diagnosis not present

## 2020-01-12 ENCOUNTER — Other Ambulatory Visit: Payer: Self-pay | Admitting: Family Medicine

## 2020-01-12 DIAGNOSIS — J019 Acute sinusitis, unspecified: Secondary | ICD-10-CM | POA: Diagnosis not present

## 2020-01-12 DIAGNOSIS — H6983 Other specified disorders of Eustachian tube, bilateral: Secondary | ICD-10-CM | POA: Diagnosis not present

## 2020-01-12 DIAGNOSIS — Z03818 Encounter for observation for suspected exposure to other biological agents ruled out: Secondary | ICD-10-CM | POA: Diagnosis not present

## 2020-02-19 ENCOUNTER — Other Ambulatory Visit: Payer: Self-pay | Admitting: Internal Medicine

## 2020-02-19 DIAGNOSIS — Z0000 Encounter for general adult medical examination without abnormal findings: Secondary | ICD-10-CM | POA: Diagnosis not present

## 2020-02-19 DIAGNOSIS — N3090 Cystitis, unspecified without hematuria: Secondary | ICD-10-CM | POA: Diagnosis not present

## 2020-02-21 ENCOUNTER — Other Ambulatory Visit: Payer: Self-pay | Admitting: Internal Medicine

## 2020-02-21 ENCOUNTER — Other Ambulatory Visit
Admission: RE | Admit: 2020-02-21 | Discharge: 2020-02-21 | Disposition: A | Discharge status: Home / Self Care | Payer: 59 | Source: Ambulatory Visit | Attending: Internal Medicine | Admitting: Internal Medicine

## 2020-02-21 DIAGNOSIS — R1031 Right lower quadrant pain: Secondary | ICD-10-CM | POA: Diagnosis not present

## 2020-02-21 DIAGNOSIS — R110 Nausea: Secondary | ICD-10-CM | POA: Diagnosis not present

## 2020-02-21 DIAGNOSIS — R109 Unspecified abdominal pain: Secondary | ICD-10-CM | POA: Diagnosis not present

## 2020-02-21 DIAGNOSIS — R3129 Other microscopic hematuria: Secondary | ICD-10-CM | POA: Diagnosis not present

## 2020-02-21 DIAGNOSIS — Z3202 Encounter for pregnancy test, result negative: Secondary | ICD-10-CM | POA: Diagnosis not present

## 2020-02-21 LAB — FIBRIN DERIVATIVES D-DIMER (ARMC ONLY): Fibrin derivatives D-dimer (ARMC): 123.09 ng/mL (FEU) (ref 0.00–499.00)

## 2020-02-24 ENCOUNTER — Encounter: Payer: Self-pay | Admitting: *Deleted

## 2020-02-24 ENCOUNTER — Other Ambulatory Visit: Payer: Self-pay

## 2020-02-24 ENCOUNTER — Ambulatory Visit (INDEPENDENT_AMBULATORY_CARE_PROVIDER_SITE_OTHER): Payer: 59 | Admitting: Urology

## 2020-02-24 ENCOUNTER — Encounter: Payer: Self-pay | Admitting: Urology

## 2020-02-24 ENCOUNTER — Telehealth: Payer: Self-pay

## 2020-02-24 ENCOUNTER — Ambulatory Visit
Admission: RE | Admit: 2020-02-24 | Discharge: 2020-02-24 | Disposition: A | Discharge status: Home / Self Care | Payer: 59 | Source: Ambulatory Visit | Attending: Urology | Admitting: Urology

## 2020-02-24 VITALS — BP 116/79 | HR 92 | Ht 67.0 in | Wt 164.0 lb

## 2020-02-24 DIAGNOSIS — R1111 Vomiting without nausea: Secondary | ICD-10-CM | POA: Diagnosis not present

## 2020-02-24 DIAGNOSIS — R1011 Right upper quadrant pain: Secondary | ICD-10-CM

## 2020-02-24 DIAGNOSIS — R3129 Other microscopic hematuria: Secondary | ICD-10-CM | POA: Diagnosis not present

## 2020-02-24 DIAGNOSIS — R300 Dysuria: Secondary | ICD-10-CM | POA: Diagnosis not present

## 2020-02-24 DIAGNOSIS — N200 Calculus of kidney: Secondary | ICD-10-CM | POA: Diagnosis not present

## 2020-02-24 LAB — URINALYSIS, COMPLETE
Bilirubin, UA: NEGATIVE
Glucose, UA: NEGATIVE
Ketones, UA: NEGATIVE
Leukocytes,UA: NEGATIVE
Nitrite, UA: NEGATIVE
Protein,UA: NEGATIVE
Specific Gravity, UA: 1.02 (ref 1.005–1.030)
Urobilinogen, Ur: 0.2 mg/dL (ref 0.2–1.0)
pH, UA: 6.5 (ref 5.0–7.5)

## 2020-02-24 LAB — MICROSCOPIC EXAMINATION: Bacteria, UA: NONE SEEN

## 2020-02-24 NOTE — Telephone Encounter (Signed)
-----   Message from Billey Co, MD sent at 02/24/2020  4:20 PM EST ----- No kidney stones seen on the CT scan, and both kidneys look fine.  There is a 5 cm cyst on the right ovary that is likely causing her pain on that side.  I would recommend she call her PCP and request a referral to GYN for a likely pelvic ultrasound and further evaluation, thanks  Nickolas Madrid, MD 02/24/2020

## 2020-02-24 NOTE — Telephone Encounter (Signed)
Called pt informed her of the information below. Pt gave verbal understanding.  

## 2020-02-24 NOTE — Patient Instructions (Signed)
Kidney Stones  Kidney stones are solid, rock-like deposits that form inside of the kidneys. The kidneys are a pair of organs that make urine. A kidney stone may form in a kidney and move into other parts of the urinary tract, including the tubes that connect the kidneys to the bladder (ureters), the bladder, and the tube that carries urine out of the body (urethra). As the stone moves through these areas, it can cause intense pain and block the flow of urine. Kidney stones are created when high levels of certain minerals are found in the urine. The stones are usually passed out of the body through urination, but in some cases, medical treatment may be needed to remove them. What are the causes? Kidney stones may be caused by:  A condition in which certain glands produce too much parathyroid hormone (primary hyperparathyroidism), which causes too much calcium buildup in the blood.  A buildup of uric acid crystals in the bladder (hyperuricosuria). Uric acid is a chemical that the body produces when you eat certain foods. It usually exits the body in the urine.  Narrowing (stricture) of one or both of the ureters.  A kidney blockage that is present at birth (congenital obstruction).  Past surgery on the kidney or the ureters, such as gastric bypass surgery. What increases the risk? The following factors may make you more likely to develop this condition:  Having had a kidney stone in the past.  Having a family history of kidney stones.  Not drinking enough water.  Eating a diet that is high in protein, salt (sodium), or sugar.  Being overweight or obese. What are the signs or symptoms? Symptoms of a kidney stone may include:  Pain in the side of the abdomen, right below the ribs (flank pain). Pain usually spreads (radiates) to the groin.  Needing to urinate frequently or urgently.  Painful urination.  Blood in the urine (hematuria).  Nausea.  Vomiting.  Fever and chills. How  is this diagnosed? This condition may be diagnosed based on:  Your symptoms and medical history.  A physical exam.  Blood tests.  Urine tests. These may be done before and after the stone passes out of your body through urination.  Imaging tests, such as a CT scan, abdominal X-ray, or ultrasound.  A procedure to examine the inside of the bladder (cystoscopy). How is this treated? Treatment for kidney stones depends on the size, location, and makeup of the stones. Kidney stones will often pass out of the body through urination. You may need to:  Increase your fluid intake to help pass the stone. In some cases, you may be given fluids through an IV and may need to be monitored at the hospital.  Take medicine for pain.  Make changes in your diet to help prevent kidney stones from coming back. Sometimes, medical procedures are needed to remove a kidney stone. This may involve:  A procedure to break up kidney stones using: ? A focused beam of light (laser therapy). ? Shock waves (extracorporeal shock wave lithotripsy).  Surgery to remove kidney stones. This may be needed if you have severe pain or have stones that block your urinary tract. Follow these instructions at home: Medicines  Take over-the-counter and prescription medicines only as told by your health care provider.  Ask your health care provider if the medicine prescribed to you requires you to avoid driving or using heavy machinery. Eating and drinking  Drink enough fluid to keep your urine pale yellow.   You may be instructed to drink at least 8-10 glasses of water each day. This will help you pass the kidney stone.  If directed, change your diet. This may include: ? Limiting how much sodium you eat. ? Eating more fruits and vegetables. ? Limiting how much animal protein--such as red meat, poultry, fish, and eggs--you eat.  Follow instructions from your health care provider about eating or drinking  restrictions. General instructions  Collect urine samples as told by your health care provider. You may need to collect a urine sample: ? 24 hours after you pass the stone. ? 8-12 weeks after passing the kidney stone, and every 6-12 months after that.  Strain your urine every time you urinate, for as long as directed. Use the strainer that your health care provider recommends.  Do not throw out the kidney stone after passing it. Keep the stone so it can be tested by your health care provider. Testing the makeup of your kidney stone may help prevent you from getting kidney stones in the future.  Keep all follow-up visits as told by your health care provider. This is important. You may need follow-up X-rays or ultrasounds to make sure that your stone has passed. How is this prevented? To prevent another kidney stone:  Drink enough fluid to keep your urine pale yellow. This is the best way to prevent kidney stones.  Eat a healthy diet and follow recommendations from your health care provider about foods to avoid. You may be instructed to eat a low-protein diet. Recommendations vary depending on the type of kidney stone that you have.  Maintain a healthy weight.   Where to find more information  National Kidney Foundation (NKF): www.kidney.org  [employer] (UCF): www.urologyhealth.org Contact a health care provider if:  You have pain that gets worse or does not get better with medicine. Get help right away if:  You have a fever or chills.  You develop severe pain.  You develop new abdominal pain.  You faint.  You are unable to urinate. Summary  Kidney stones are solid, rock-like deposits that form inside of the kidneys.  Kidney stones can cause nausea, vomiting, blood in the urine, abdominal pain, and the urge to urinate frequently.  Treatment for kidney stones depends on the size, location, and makeup of the stones. Kidney stones will often pass out of the body  through urination.  Kidney stones can be prevented by drinking enough fluids, eating a healthy diet, and maintaining a healthy weight. This information is not intended to replace advice given to you by your health care provider. Make sure you discuss any questions you have with your health care provider. Document Revised: 05/20/2018 Document Reviewed: 05/20/2018 Elsevier Patient Education  2021 Elsevier Inc.  

## 2020-02-24 NOTE — Progress Notes (Signed)
   02/24/20 4:41 PM   Sonia Webb 08/28/1990 [id number]  CC: Right flank pain  HPI: I saw Sonia Webb in urology clinic today for 1 week of right-sided flank pain.  She is a 30 year old healthy female with a history of von Willebrand's disease who reports 1 week of right-sided low back pain and some nausea.  She was originally thought to have a UTI and treated with Cipro without any significant improvement in her urinary symptoms.  She also had microscopic hematuria at that time with 3-10 RBCs and was thought to possibly have a kidney stone was referred to urology.  A KUB was performed at [facility], and reportedly showed no evidence of stone disease.  She thinks she may have had a kidney stone when she was much younger, but does not remember passing any stones.  She has a family history of kidney stones in her dad.  She is currently on Zofran, Cipro, prednisone, and hydrocodone.  She has some mild burning after urination.  She continues to endorse some right-sided low back pain.  Urinalysis today is completely benign with 0-5 WBCs, 0-2 RBCs, no bacteria, nitrite negative, no leukocytes.   PMH: Past Medical History:  Diagnosis Date  . Episodic tension-type headache, not intractable 06/09/2014  . Family history of diabetes mellitus (DM) 07/22/2018   Formatting of this note might be different from the original. Mother  . Gastroesophageal reflux disease without esophagitis 07/22/2018  . Iron deficiency anemia due to chronic blood loss 11/27/2012  . Pernicious anemia 07/24/2019   Formatting of this note might be different from the original. 153, 2020  . Type 2B von Willebrand's disease ([place]) 07/22/2018   Social History:  reports that she has never smoked. She has never used smokeless tobacco. No history on file for alcohol use and drug use.  Physical Exam: BP 116/79   Pulse 92   Ht 5\' 7"  (1.702 m)   Wt 164 lb (74.4 kg)   BMI 25.69 kg/m    Constitutional:  Alert and oriented, No acute  distress. Cardiovascular: No clubbing, cyanosis, or edema. Respiratory: Normal respiratory effort, no increased work of breathing.  Laboratory Data: Reviewed, see HPI  Pertinent Imaging: I have personally viewed and interpreted the stat CT stone protocol today that shows no hydronephrosis, 2 mm left nonobstructing upper pole stone, and a 5.3 cm right adnexal cyst.  Further evaluation with ultrasound was recommended.  Assessment & Plan:   She is a 30 year old female with right-sided flank pain and a history of microscopic hematuria possibly concerning for an acute ureteral stone episode.  A stat CT scan was ordered and completed this afternoon, and showed no evidence of obstructing stone disease or hydronephrosis, but a 5.3 cm right adnexal cyst that may be the cause of her right-sided pain.  We will contact her with these results, and I recommended reaching out to her PCP for a referral to GYN for a prompt pelvic ultrasound as recommended by radiology and further evaluation of this large adnexal cyst that may be the cause of her right-sided pain.  Follow-up with urology as needed.  Nickolas Madrid, MD 02/24/2020  [facility] [address], [place], [place] [address] [phone]

## 2020-02-29 DIAGNOSIS — N83201 Unspecified ovarian cyst, right side: Secondary | ICD-10-CM | POA: Diagnosis not present

## 2020-03-07 ENCOUNTER — Other Ambulatory Visit: Payer: Self-pay | Admitting: Obstetrics and Gynecology

## 2020-03-07 DIAGNOSIS — N83292 Other ovarian cyst, left side: Secondary | ICD-10-CM | POA: Diagnosis not present

## 2020-03-07 DIAGNOSIS — R102 Pelvic and perineal pain: Secondary | ICD-10-CM | POA: Diagnosis not present

## 2020-03-07 DIAGNOSIS — N83291 Other ovarian cyst, right side: Secondary | ICD-10-CM | POA: Diagnosis not present

## 2020-03-07 DIAGNOSIS — N83201 Unspecified ovarian cyst, right side: Secondary | ICD-10-CM | POA: Diagnosis not present

## 2020-04-04 DIAGNOSIS — N83202 Unspecified ovarian cyst, left side: Secondary | ICD-10-CM | POA: Diagnosis not present

## 2020-04-04 DIAGNOSIS — R102 Pelvic and perineal pain: Secondary | ICD-10-CM | POA: Diagnosis not present

## 2020-04-04 DIAGNOSIS — N83201 Unspecified ovarian cyst, right side: Secondary | ICD-10-CM | POA: Diagnosis not present

## 2020-04-06 ENCOUNTER — Other Ambulatory Visit: Payer: Self-pay | Admitting: Internal Medicine

## 2020-04-06 ENCOUNTER — Ambulatory Visit: Payer: 59 | Attending: Internal Medicine

## 2020-04-06 DIAGNOSIS — Z23 Encounter for immunization: Secondary | ICD-10-CM

## 2020-04-06 NOTE — Progress Notes (Signed)
   Covid-19 Vaccination Clinic  Name:  AVABELLA WAILES    MRN: [id number] DOB: 04/17/1990  04/06/2020  Ms. Gomm was observed post Covid-19 immunization for 15 minutes without incident. She was provided with Vaccine Information Sheet and instruction to access the V-Safe system.   Ms. Storti was instructed to call 911 with any severe reactions post vaccine: Marland Kitchen Difficulty breathing  . Swelling of face and throat  . A fast heartbeat  . A bad rash all over body  . Dizziness and weakness   Immunizations Administered    Name Date Dose VIS Date Route   PFIZER Comrnaty(Gray TOP) Covid-19 Vaccine 04/06/2020  2:01 PM 0.3 mL 12/24/2019 Intramuscular   Manufacturer: [place]   Lot: [id number]   [place]: [id number]      Covid-19 Vaccination Clinic  Name:  YVONNIA TANGO    MRN: [id number] DOB: August 30, 1990  04/06/2020  Ms. Christon was observed post Covid-19 immunization for 15 minutes without incident. She was provided with Vaccine Information Sheet and instruction to access the V-Safe system.   Ms. Lieber was instructed to call 911 with any severe reactions post vaccine: Marland Kitchen Difficulty breathing  . Swelling of face and throat  . A fast heartbeat  . A bad rash all over body  . Dizziness and weakness   Immunizations Administered    Name Date Dose VIS Date Route   PFIZER Comrnaty(Gray TOP) Covid-19 Vaccine 04/06/2020  2:01 PM 0.3 mL 12/24/2019 Intramuscular   Manufacturer: [employer]   Lot: [id number]   [place]: [phone]

## 2020-04-11 ENCOUNTER — Inpatient Hospital Stay: Payer: 59

## 2020-04-11 ENCOUNTER — Inpatient Hospital Stay: Payer: 59 | Attending: Oncology | Admitting: Oncology

## 2020-04-11 ENCOUNTER — Other Ambulatory Visit: Payer: Self-pay | Admitting: Obstetrics and Gynecology

## 2020-04-11 ENCOUNTER — Encounter: Payer: Self-pay | Admitting: Oncology

## 2020-04-11 VITALS — BP 121/81 | HR 91 | Temp 99.2°F | Resp 18 | Ht 67.0 in | Wt 163.3 lb

## 2020-04-11 DIAGNOSIS — D6800 Von Willebrand disease, unspecified: Secondary | ICD-10-CM

## 2020-04-11 DIAGNOSIS — Z79899 Other long term (current) drug therapy: Secondary | ICD-10-CM | POA: Insufficient documentation

## 2020-04-11 DIAGNOSIS — D500 Iron deficiency anemia secondary to blood loss (chronic): Secondary | ICD-10-CM

## 2020-04-11 DIAGNOSIS — D509 Iron deficiency anemia, unspecified: Secondary | ICD-10-CM | POA: Insufficient documentation

## 2020-04-11 LAB — CBC WITH DIFFERENTIAL/PLATELET
Abs Immature Granulocytes: 0.01 10*3/uL (ref 0.00–0.07)
Basophils Absolute: 0.1 10*3/uL (ref 0.0–0.1)
Basophils Relative: 1 %
Eosinophils Absolute: 0.1 10*3/uL (ref 0.0–0.5)
Eosinophils Relative: 1 %
HCT: 40 % (ref 36.0–46.0)
Hemoglobin: 13.1 g/dL (ref 12.0–15.0)
Immature Granulocytes: 0 %
Lymphocytes Relative: 25 %
Lymphs Abs: 1.7 10*3/uL (ref 0.7–4.0)
MCH: 26.2 pg (ref 26.0–34.0)
MCHC: 32.8 g/dL (ref 30.0–36.0)
MCV: 80 fL (ref 80.0–100.0)
Monocytes Absolute: 0.4 10*3/uL (ref 0.1–1.0)
Monocytes Relative: 6 %
Neutro Abs: 4.7 10*3/uL (ref 1.7–7.7)
Neutrophils Relative %: 67 %
Platelets: 307 10*3/uL (ref 150–400)
RBC: 5 MIL/uL (ref 3.87–5.11)
RDW: 15 % (ref 11.5–15.5)
WBC: 7 10*3/uL (ref 4.0–10.5)
nRBC: 0 % (ref 0.0–0.2)

## 2020-04-11 LAB — COMPREHENSIVE METABOLIC PANEL
ALT: 7 U/L (ref 0–44)
AST: 14 U/L — ABNORMAL LOW (ref 15–41)
Albumin: 4.3 g/dL (ref 3.5–5.0)
Alkaline Phosphatase: 32 U/L — ABNORMAL LOW (ref 38–126)
Anion gap: 13 (ref 5–15)
BUN: 10 mg/dL (ref 6–20)
CO2: 20 mmol/L — ABNORMAL LOW (ref 22–32)
Calcium: 9 mg/dL (ref 8.9–10.3)
Chloride: 104 mmol/L (ref 98–111)
Creatinine, Ser: 0.63 mg/dL (ref 0.44–1.00)
GFR, Estimated: >60 mL/min (ref >60)
Glucose, Bld: 91 mg/dL (ref 70–99)
Potassium: 3.9 mmol/L (ref 3.5–5.1)
Sodium: 137 mmol/L (ref 135–145)
Total Bilirubin: 0.8 mg/dL (ref 0.3–1.2)
Total Protein: 7.8 g/dL (ref 6.5–8.1)

## 2020-04-11 LAB — APTT: aPTT: 30 seconds (ref 24–36)

## 2020-04-11 LAB — IRON AND TIBC
Iron: 41 ug/dL (ref 28–170)
Saturation Ratios: 6 % — ABNORMAL LOW (ref 10.4–31.8)
TIBC: 671 ug/dL — ABNORMAL HIGH (ref 250–450)
UIBC: 630 ug/dL

## 2020-04-11 LAB — PROTIME-INR
INR: 1 (ref 0.8–1.2)
Prothrombin Time: 12.8 seconds (ref 11.4–15.2)

## 2020-04-11 LAB — FERRITIN: Ferritin: 4 ng/mL — ABNORMAL LOW (ref 11–307)

## 2020-04-11 NOTE — Progress Notes (Signed)
Patient here to establish care  

## 2020-04-11 NOTE — Progress Notes (Signed)
Hematology/Oncology Consult note [facility] Telephone:([phone] [phone]   Patient Care Team: Rusty Aus, MD as PCP - General (Internal Medicine)  REFERRING PROVIDER: Schermerhorn, Gwen Her,*  CHIEF COMPLAINTS/REASON FOR VISIT:  Evaluation of history of von Willebrand disease  HISTORY OF PRESENTING ILLNESS:   Sonia Webb is a  30 y.o.  female with PMH listed below was seen in consultation at the request of  Schermerhorn, Gwen Her,*  for evaluation of history of von Willebrand disease  Patient recently seen by GYN Dr. Ouida Sills and there is plan for right ovarian cystectomy, with possibility of bilateral and possible excision of endometriosis.  Patient has a history of von Willebrand disease and was referred to hematology for preoperation clearance.  In current medical problem list, patient was listed to have type IIb von Willebrand disease. I reviewed patient's previous medical records via Care Everywhere.  11/28/2022 patient was seen by Dr.Ma Akice De-Ling.  At that time patient was noted to have type I von Willebrand disease.  Dr. Hassell Done noted that her baseline level of VWF antigen and activity of 41% and 34%, respectively. The PFA-100 was also abnormal, with col/epi of 237 sec and col/ADP of 153 sec. Th this was based on her work-up in 2010 when she had GI bleeding.  Lab results were not available to me. Patient was recommended to use DDAVP prior to any invasive procedure.  Patient reports that she has not been using DDAVP much.  She has used 1 time prior to endoscopy procedure in the past.  Bleeding history-- Tonsillectomy and adenoidectomy at age 82 with prolonged bleeding afterward. Wisdom teeth extraction 2010, with prolonged bleeding GI bleeding 2010--this eventually prompted evaluation with above numbers.  Patient reports easy bruising.  Denies any epistaxis, hemoptysis, black or bloody stool. She has heavy menstrual.     History of iron deficiency, GERD,.    Review of Systems  Constitutional: Negative for appetite change, chills, fatigue and fever.  HENT:   Negative for hearing loss and voice change.   Eyes: Negative for eye problems.  Respiratory: Negative for chest tightness and cough.   Cardiovascular: Negative for chest pain.  Gastrointestinal: Negative for abdominal distention, abdominal pain and blood in stool.  Endocrine: Negative for hot flashes.  Genitourinary: Positive for menstrual problem. Negative for difficulty urinating and frequency.   Musculoskeletal: Negative for arthralgias.  Skin: Negative for itching and rash.  Neurological: Negative for extremity weakness.  Hematological: Negative for adenopathy. Bruises/bleeds easily.  Psychiatric/Behavioral: Negative for confusion.    MEDICAL HISTORY:  Past Medical History:  Diagnosis Date  . Episodic tension-type headache, not intractable 06/09/2014  . Family history of diabetes mellitus (DM) 07/22/2018   Formatting of this note might be different from the original. Mother  . Gastroesophageal reflux disease without esophagitis 07/22/2018  . Iron deficiency anemia due to chronic blood loss 11/27/2012  . Pernicious anemia 07/24/2019   Formatting of this note might be different from the original. 153, 2020  . Type 2B von Willebrand's disease ([place]) 07/22/2018    SURGICAL HISTORY: Past Surgical History:  Procedure Laterality Date  . TONSILLECTOMY    . WISDOM TOOTH EXTRACTION  2010    SOCIAL HISTORY: Social History   Socioeconomic History  . Marital status: Single    Spouse name: Not on file  . Number of children: Not on file  . Years of education: Not on file  . Highest education level: Not on file  Occupational History  . Occupation:  [employer] [place] Use  . Smoking status: Never Smoker  . Smokeless tobacco: Never Used  Vaping Use  . Vaping Use: Never used  Substance and Sexual Activity  . Alcohol use: Never     Alcohol/week: 0.0 standard drinks  . Drug use: Never  . Sexual activity: Not on file  Other Topics Concern  . Not on file  Social History Narrative  . Not on file   Social Determinants of Health   Financial Resource Strain: Not on file  Food Insecurity: Not on file  Transportation Needs: Not on file  Physical Activity: Not on file  Stress: Not on file  Social Connections: Not on file  Intimate Partner Violence: Not on file    FAMILY HISTORY: Family History  Problem Relation Age of Onset  . Diabetes Mother   . Hypertension Mother   . Prostate cancer Father   . Hypertension Father   . Bone cancer Maternal Grandfather     ALLERGIES:  is allergic to lamotrigine, penicillins, aspirin, nsaids, divalproex sodium, ibuprofen, valproic acid, and cephalosporins.  MEDICATIONS:  Current Outpatient Medications  Medication Sig Dispense Refill  . acetaminophen (TYLENOL) 500 MG tablet Take 1,000 mg by mouth as needed.     . desmopressin (STIMATE) 1.5 MG/ML SOLN Place into the nose. Place 1 spray into both nostrils once daily as needed One spray in each nostril every 24-48 hours prn bleeding and as directed 60-90 minutes prior to procedures. Max 2 doses in one week.    . ferrous sulfate 325 (65 FE) MG tablet Take 650 mg by mouth as needed.     Marland Kitchen HYDROcodone-acetaminophen (NORCO/VICODIN) 5-325 MG tablet Take 1 tablet by mouth every 6 (six) hours as needed.    . pantoprazole (PROTONIX) 40 MG tablet Take 40 mg by mouth daily.     . polyethylene glycol powder (GLYCOLAX/MIRALAX) 17 GM/SCOOP powder Take by mouth as needed.     . tranexamic acid (LYSTEDA) 650 MG TABS tablet Take by mouth as needed.     Matilde Bash 1/35 tablet Take by mouth.     Current Facility-Administered Medications  Medication Dose Route Frequency Provider Last Rate Last Admin  . betamethasone acetate-betamethasone sodium phosphate (CELESTONE) injection 3 mg  3 mg Intramuscular Once Daylene Katayama M, DPM      . betamethasone  acetate-betamethasone sodium phosphate (CELESTONE) injection 3 mg  3 mg Intramuscular Once Edrick Kins, DPM         PHYSICAL EXAMINATION: ECOG PERFORMANCE STATUS: 0 - Asymptomatic Vitals:   04/11/20 1116  BP: 121/81  Pulse: 91  Resp: 18  Temp: 99.2 F (37.3 C)   Filed Weights   04/11/20 1116  Weight: 163 lb 4.8 oz (74.1 kg)    Physical Exam Constitutional:      General: She is not in acute distress. HENT:     Head: Normocephalic and atraumatic.  Eyes:     General: No scleral icterus. Cardiovascular:     Rate and Rhythm: Normal rate and regular rhythm.     Heart sounds: Normal heart sounds.  Pulmonary:     Effort: Pulmonary effort is normal. No respiratory distress.     Breath sounds: No wheezing.  Abdominal:     General: Bowel sounds are normal. There is no distension.     Palpations: Abdomen is soft.  Musculoskeletal:        General: No deformity. Normal range of motion.     Cervical back: Normal range of  motion and neck supple.  Skin:    General: Skin is warm and dry.     Findings: No erythema or rash.  Neurological:     Mental Status: She is alert and oriented to person, place, and time. Mental status is at baseline.     Cranial Nerves: No cranial nerve deficit.     Coordination: Coordination normal.  Psychiatric:        Mood and Affect: Mood normal.     LABORATORY DATA:  I have reviewed the data as listed Lab Results  Component Value Date   WBC 7.0 04/11/2020   HGB 13.1 04/11/2020   HCT 40.0 04/11/2020   MCV 80.0 04/11/2020   PLT 307 04/11/2020   Recent Labs    04/11/20 1148  NA 137  K 3.9  CL 104  CO2 20*  GLUCOSE 91  BUN 10  CREATININE 0.63  CALCIUM 9.0  GFRNONAA >60  PROT 7.8  ALBUMIN 4.3  AST 14*  ALT 7  ALKPHOS 32*  BILITOT 0.8   Iron/TIBC/Ferritin/ %Sat    Component Value Date/Time   IRON 41 04/11/2020 1148   TIBC 671 (H) 04/11/2020 1148   FERRITIN 4 (L) 04/11/2020 1148   IRONPCTSAT 6 (L) 04/11/2020 1148       RADIOGRAPHIC STUDIES: I have personally reviewed the radiological images as listed and agreed with the findings in the report. No results found.    ASSESSMENT & PLAN:  1. Von Willebrand disease ([place])   2. Iron deficiency anemia due to chronic blood loss    #I recommend patient to check CBC, CMP, von Willebrand panel, iron TIBC ferritin, PT and PTT. The planned surgery is right ovarian cystectomy, with possibility of bilateral and possible excision of endometriosis.-Major abdomen surgery.   # Iron deficiency anemia She has tried oral iron supplementation.  Iron panel showed ferritin of 4, iron saturation 6.  Discussed about the allergy reactions/infusion reaction including anaphylactic reaction discussed with patient. Other side effects include but not limited to high blood pressure, skin rash, weight gain, leg swelling, etc. Patient voices understanding and may consider after her lab results.  Plan IV iron with Venofer 200mg  weekly x 2 doses. Will ask staff to reach out to her. If she agrees, will arrange. She reports that she is not sexually active and no chance to be pregnant. Understands that IV venofer is not indicated during first trimester pregnancy.  Patient has received IV Venofer treatment on 04/22/2020 and 04/29/2020.  #Labs are reviewed and discussed with patient. Factor VIII 116% Ristocetin Co factor 77% Von Willebrand antigen 90% Normal PT, PTT.  Increased the level could be due to use of OCP. I don't have her recent baseline prior to the use of OCP.  Regarding to humate P replacement therapy, I think risk of thrombosis overweighs her bleeding risk given her current normal von Willebrand panel levels. Even though her levels are currently normal, I recommend patient to utilize DDAVP intranasal spray 90 minutes prior to procedure to further increase the level and minimize her bleeding risk.  I discussed with patient's GYN surgeon Dr. Ouida Sills who informed me that the  planned surgery is laparoscopic ovarian cyst removal, anticipate duration of the surgery is about 1 hour. I talked to our inpatient pharmacist Laqueta Carina to check if we have Humate-P in stock in case patient develops intraoperative or postoperative bleeding.  Per Lor[facility]on does not have Humate-P available, [facility] has it in stock.  Current posted surgery date 05/27/2020  was provided to Manuela Schwartz who will look into getting medication to [facility] in case patient needs.  Discussed the plan with patient and she agrees.   Orders Placed This Encounter  Procedures  . CBC with Differential/Platelet    Standing Status:   Future    Number of Occurrences:   1    Standing Expiration Date:   04/11/2021  . Ferritin    Standing Status:   Future    Number of Occurrences:   1    Standing Expiration Date:   10/12/2020  . Iron and TIBC    Standing Status:   Future    Number of Occurrences:   1    Standing Expiration Date:   04/11/2021  . Von Willebrand panel    Standing Status:   Future    Number of Occurrences:   1    Standing Expiration Date:   04/11/2021  . Comprehensive metabolic panel    Standing Status:   Future    Number of Occurrences:   1    Standing Expiration Date:   04/11/2021  . APTT    Standing Status:   Future    Number of Occurrences:   1    Standing Expiration Date:   04/11/2021  . Protime-INR    Standing Status:   Future    Number of Occurrences:   1    Standing Expiration Date:   04/11/2021    All questions were answered. The patient knows to call the clinic with any problems questions or concerns.  cc Schermerhorn, Gwen Her,*    Return of visit: To be determined.  Thank you for this kind referral and the opportunity to participate in the care of this patient. A copy of today's note is routed to referring provider    Earlie Server, MD, PhD Hematology Oncology [facility] Pager- [phone] 04/11/2020

## 2020-04-13 LAB — VON WILLEBRAND PANEL
Coagulation Factor VIII: 116 % (ref 56–140)
Ristocetin Co-factor, Plasma: 77 % (ref 50–200)
Von Willebrand Antigen, Plasma: 90 % (ref 50–200)

## 2020-04-13 LAB — COAG STUDIES INTERP REPORT

## 2020-04-20 ENCOUNTER — Telehealth: Payer: Self-pay

## 2020-04-20 NOTE — Telephone Encounter (Signed)
Please schedule *new* venofer as MD advise and inform patient of appt detail.

## 2020-04-20 NOTE — Telephone Encounter (Signed)
Done.. Pt has been sched as requested for IV venofer weekly x 2.  1st dose **NEW** Pt was made aware of her upcoming appts

## 2020-04-20 NOTE — Telephone Encounter (Signed)
-----   Message from Earlie Server, MD sent at 04/19/2020  5:08 PM EDT ----- Please schedule her to do IV venofer weekly x 2. Thanks. She is aware. Follow up TBD

## 2020-04-22 ENCOUNTER — Inpatient Hospital Stay: Payer: 59 | Attending: Oncology

## 2020-04-22 VITALS — BP 127/67 | HR 90 | Temp 99.0°F | Resp 18

## 2020-04-22 DIAGNOSIS — D680 Von Willebrand's disease: Secondary | ICD-10-CM | POA: Insufficient documentation

## 2020-04-22 DIAGNOSIS — D500 Iron deficiency anemia secondary to blood loss (chronic): Secondary | ICD-10-CM | POA: Diagnosis not present

## 2020-04-22 MED ORDER — IRON SUCROSE 20 MG/ML IV SOLN
200.0000 mg | Freq: Once | INTRAVENOUS | Status: AC
Start: 2020-04-22 — End: 2020-04-22
  Administered 2020-04-22: 200 mg via INTRAVENOUS
  Filled 2020-04-22: qty 10

## 2020-04-22 MED ORDER — SODIUM CHLORIDE 0.9 % IV SOLN: 200.0000 mg | Freq: Once | INTRAVENOUS | Status: DC

## 2020-04-22 MED ORDER — SODIUM CHLORIDE 0.9 % IV SOLN
Freq: Once | INTRAVENOUS | Status: AC
Start: 2020-04-22 — End: 2020-04-22
  Filled 2020-04-22: qty 250

## 2020-04-29 ENCOUNTER — Inpatient Hospital Stay: Payer: 59

## 2020-04-29 ENCOUNTER — Other Ambulatory Visit: Payer: Self-pay

## 2020-04-29 VITALS — BP 118/80 | HR 83 | Temp 98.8°F | Resp 18

## 2020-04-29 DIAGNOSIS — D500 Iron deficiency anemia secondary to blood loss (chronic): Secondary | ICD-10-CM

## 2020-04-29 DIAGNOSIS — D680 Von Willebrand's disease: Secondary | ICD-10-CM | POA: Diagnosis not present

## 2020-04-29 MED ORDER — SODIUM CHLORIDE 0.9 % IV SOLN: 200.0000 mg | Freq: Once | INTRAVENOUS | Status: DC

## 2020-04-29 MED ORDER — IRON SUCROSE 20 MG/ML IV SOLN
200.0000 mg | Freq: Once | INTRAVENOUS | Status: AC
  Administered 2020-04-29: 200 mg via INTRAVENOUS
  Filled 2020-04-29: qty 10

## 2020-04-29 MED ORDER — SODIUM CHLORIDE 0.9 % IV SOLN
Freq: Once | INTRAVENOUS | Status: AC
  Filled 2020-04-29: qty 250

## 2020-05-03 ENCOUNTER — Encounter: Payer: Self-pay | Admitting: Oncology

## 2020-05-03 ENCOUNTER — Telehealth: Payer: Self-pay | Admitting: *Deleted

## 2020-05-03 NOTE — Telephone Encounter (Signed)
Patient called and said to let Dr Tasia Catchings know that her surgery is scheduled for 05/27/20

## 2020-05-03 NOTE — Telephone Encounter (Signed)
Team, please check with her if she has DDAVP?

## 2020-05-04 NOTE — Progress Notes (Signed)
MD wants to have Humate P in stock in case patient needs for surgery.  Dose of 50-80 units/kg initial dose, subsequent doses of 40-60units/kg every 8-24h.  Contacted main pharmacy to order for inpatient use.

## 2020-05-04 NOTE — Telephone Encounter (Signed)
patient states that she has sent a request for refill on a script that she has and will let us know if they have it in stock

## 2020-05-05 ENCOUNTER — Other Ambulatory Visit: Payer: Self-pay | Admitting: Oncology

## 2020-05-05 ENCOUNTER — Telehealth: Payer: Self-pay | Admitting: *Deleted

## 2020-05-05 ENCOUNTER — Other Ambulatory Visit: Payer: Self-pay

## 2020-05-05 DIAGNOSIS — R1011 Right upper quadrant pain: Secondary | ICD-10-CM

## 2020-05-05 MED ORDER — DESMOPRESSIN ACETATE 1.5 MG/ML NA SOLN
1.0000 | NASAL | 1 refills | Status: DC
  Filled 2020-05-05: qty 2.5, fill #0

## 2020-05-05 NOTE — Telephone Encounter (Signed)
Pt notified of refill via mychart

## 2020-05-05 NOTE — Telephone Encounter (Signed)
refilled 

## 2020-05-05 NOTE — Telephone Encounter (Signed)
The nasal spray you ordered has been taken off the market. Please send prescription for something different

## 2020-05-06 ENCOUNTER — Other Ambulatory Visit: Payer: Self-pay | Admitting: Oncology

## 2020-05-06 ENCOUNTER — Other Ambulatory Visit: Payer: Self-pay

## 2020-05-06 NOTE — Telephone Encounter (Signed)
Dr. Tasia Catchings has talked with pharmacy and they are currently trying to see if there is another equivalent to medication sent.

## 2020-05-09 ENCOUNTER — Other Ambulatory Visit: Payer: Self-pay | Admitting: Obstetrics and Gynecology

## 2020-05-09 ENCOUNTER — Other Ambulatory Visit: Payer: Self-pay | Admitting: Oncology

## 2020-05-09 ENCOUNTER — Other Ambulatory Visit: Payer: Self-pay

## 2020-05-09 MED ORDER — GABAPENTIN 300 MG PO CAPS
ORAL_CAPSULE | ORAL | 11 refills | Status: AC
  Filled 2020-05-09: qty 10, 10d supply, fill #0
  Filled 2020-05-27: qty 10, 10d supply, fill #1
  Filled 2020-09-30: qty 10, 10d supply, fill #2
  Filled 2021-02-18: qty 10, 10d supply, fill #3

## 2020-05-09 MED ORDER — ONDANSETRON HCL 8 MG PO TABS
ORAL_TABLET | ORAL | 0 refills | Status: DC
  Filled 2020-05-09: qty 30, 10d supply, fill #0

## 2020-05-09 MED ORDER — HYDROCODONE-ACETAMINOPHEN 5-325 MG PO TABS
ORAL_TABLET | ORAL | 0 refills | Status: DC
  Filled 2020-05-09: qty 20, 4d supply, fill #0

## 2020-05-09 MED ORDER — DESMOPRESSIN ACETATE SPRAY 0.01 % NA SOLN
1.0000 | NASAL | 1 refills | Status: AC
  Filled 2020-05-09: qty 5, 10d supply, fill #0

## 2020-05-09 NOTE — H&P (Addendum)
Chief Complaint:    Ms. Tomer is a 30 y.o. female here forL/S right ovarian , possible bilateral ovarian cystectomies  .pt here for follow up for bilateral ovarian cyst and right sided pelvic pain starting in Feb . Pain has persisted requiring narcotics at times . NSAID use still . Upon further questioning she states that for several years that she has had some premenstrual pelvic pain  She has not been sexually active before  No FHX of pelvic pain   U/s 04/04/2020:   Uterusanteflexed  Endometrium=17.28mm  Rt ovary contains 2cysts=1)complex septated hemorrhagic=4.4cm; septation=0.19cm 2)simple=1.6cm  Rt ovary volume=44.7ml  Doppler performed on Rt ovary  Rt ovarian cyst volume=26.78ml  Lt complex ovarian cyst=2cm Free fluid seen in PCDS  No significant change in the appearance of the right ovarian cyst - still ground glass   Pt has a h/o VonWillebrands disease - no recent work up  Pt is on continuous  OCPs  Past Medical History:  has a past medical history of Anemia (2005), Anxiety, Chickenpox, Depression, major, single episode, complete remission (CMS-HCC), Depressive disorder, GERD (gastroesophageal reflux disease) (2012), Von Willebrand disease (CMS-HCC), and Von Willebrand disease (CMS-HCC) (2010).  Past Surgical History:  has a past surgical history that includes Tonsillectomy and adenoidectomy. Family History: family history includes Alzheimer's disease in her paternal grandfather; Anxiety in her mother; Arthritis in an other family member; Cancer in an other family member; Colon polyps in her mother; Coronary Artery Disease (Blocked arteries around heart) in her maternal grandfather; Depression in her maternal grandfather and mother; Diabetes in an other family member; Diabetes type II in her brother, maternal grandfather, and mother; High blood pressure (Hypertension) in her brother, father, mother, paternal grandfather, and another family member;  Leukemia in an other family member; Lupus in an other family member; Myocardial Infarction (Heart attack) in an other family member; Obesity in her mother; Prostate cancer in her father; Psoriasis in an other family member; Skin cancer in her maternal grandmother and mother; Stroke in her maternal grandmother and another family member; Thyroid disease in her maternal aunt, mother, and another family member. Social History:  reports that she has never smoked. She has never used smokeless tobacco. She reports that she does not drink alcohol and does not use drugs. OB/GYN History:          OB History    Gravida  0   Para  0   Term  0   Preterm  0   AB  0   Living  0     SAB  0   IAB  0   Ectopic  0   Molar  0   Multiple  0   Live Births  0          Allergies: is allergic to penicillins, aspirin, nsaids (non-steroidal anti-inflammatory drug), cephalosporins, depakote [divalproex], divalproex sodium, ibuprofen, lamictal [lamotrigine], and valproic acid. Medications:  Current Outpatient Medications:  .  acetaminophen (TYLENOL) 500 MG tablet, Take 1,000 mg by mouth every 8 (eight) hours as needed., Disp: , Rfl:  .  cyanocobalamin, vitamin B-12, 1,000 mcg Lozg, Place 1 lozenge under the tongue once daily, Disp: , Rfl:  .  desmopressin (STIMATE) 150 mcg/spray (0.1 mL) Spry nasal spray, Place 1 spray into both nostrils once daily as needed One spray in each nostril every 24-48 hours prn bleeding and as directed 60-90 minutes prior to procedures. Max 2 doses in one week., Disp: 1 Bottle, Rfl: 5 .  ferrous gluconate (FERGON)  324 MG tablet, Take 1 tablet (324 mg total) by mouth daily with breakfast, Disp: 90 tablet, Rfl: 3 .  HYDROcodone-acetaminophen (NORCO) 5-325 mg tablet, Take 1 tablet by mouth every 6 (six) hours as needed for Pain for up to 20 doses, Disp: 20 tablet, Rfl: 0 .  HYDROcodone-acetaminophen (NORCO) 5-325 mg tablet, POST OP 1 TABLETS EVERY 12 HOURS PRN pain,  Disp: 20 tablet, Rfl: 0 .  ibuprofen (MOTRIN) 800 MG tablet, Take 1 tablet (800 mg total) by mouth every 8 (eight) hours as needed for Pain, Disp: 30 tablet, Rfl: 1 .  norethindrone-ethinyl estradiol (NORTREL 1/35, 28,) 1-35 mg-mcg tablet, 1 tab po q day continuously for 2 packs, Disp: 56 tablet, Rfl: 0 .  pantoprazole (PROTONIX) 40 MG DR tablet, Take 1 tablet (40 mg total) by mouth once daily, Disp: 90 tablet, Rfl: 3 .  polyethylene glycol (MIRALAX) powder, Take by mouth once daily as needed., Disp: , Rfl:  .  tranexamic acid (LYSTEDA) 650 mg tablet, Take 1,300 mg by mouth 3 (three) times daily Take for a maximum of 5 days during monthly menstruation., Disp: , Rfl:   Review of Systems: General:                      No fatigue or weight loss Eyes:                           No vision changes Ears:                            No hearing difficulty Respiratory:                No cough or shortness of breath Pulmonary:                  No asthma or shortness of breath Cardiovascular:           No chest pain, palpitations, dyspnea on exertion Gastrointestinal:          No abdominal bloating, chronic diarrhea, constipations, masses, pain or hematochezia Genitourinary:             No hematuria, dysuria, abnormal vaginal discharge,+ pelvic pain, Menometrorrhagia Lymphatic:                   No swollen lymph nodes Musculoskeletal:         No muscle weakness Neurologic:                  No extremity weakness, syncope, seizure disorder Psychiatric:                  No history of depression, delusions or suicidal/homicidal ideation    Exam:      Vitals:   05/09/20    BP: 130/73  Pulse: 103    Body mass index is 25.69 kg/m.  WDWN white/ female in NAD   Lungs: CTA  CV : RRR without murmur   Breast: exam done in sitting and lying position : No dimpling or retraction, no dominant mass, no spontaneous discharge, no axillary adenopathy Neck:  no thyromegaly Abdomen: soft , no mass, normal  active bowel sounds,  non-tender, no rebound tenderness Pelvic: tanner stage 5 ,  External genitalia: vulva /labia no lesions Urethra: no prolapse Vagina: normal physiologic d/c Cervix: no lesions, no cervical motion tenderness   Uterus: normal size shape and contour,  non-tender Adnexa: no mass,  non-tender   Rectovaginal:  Impression:   The primary encounter diagnosis was Right ovarian cyst. Diagnoses of Pelvic pain in female and Ovarian cyst, left were also pertinent to this visit.  Pain and appearance of her cyst especially right are c/w endometriosis- no interval change for last u/s   Plan:   Hematology recommends her using her Ddavp or equivalent  perioperatively  Operative L/S with right ovarian cystectomy , possible bilateral  And possible excision of endometriosis  Benefits and risks to surgery: The proposed benefit of the surgery has been discussed with the patient. The possible risks include, but are not limited to: organ injury to the bowel , bladder, ureters, and major blood vessels and nerves. There is a possibility of additional surgeries resulting from these injuries. There is also the risk of blood transfusion and the need to receive blood products during or after the procedure which may rarely lead to HIV or Hepatitis C infection. There is a risk of developing a deep venous thrombosis or a pulmonary embolism . There is the possibility of wound infection and also anesthetic complications, even the rare possibility of death. The patient understands these risks and wishes to proceed. All questions have been answered      Caroline Sauger, MD

## 2020-05-09 NOTE — Telephone Encounter (Signed)
I have talked to patient's pharmacy and ordered alternative generic version of DDAVP.

## 2020-05-10 ENCOUNTER — Other Ambulatory Visit: Payer: Self-pay

## 2020-05-10 MED ORDER — FERROUS GLUCONATE 324 (38 FE) MG PO TABS: ORAL_TABLET | ORAL | 3 refills | Status: AC

## 2020-05-10 MED ORDER — DESMOPRESSIN ACETATE 1.5 MG/ML NA SOLN: NASAL | 1 refills | Status: AC

## 2020-05-17 ENCOUNTER — Encounter
Admission: RE | Admit: 2020-05-17 | Discharge: 2020-05-17 | Disposition: A | Discharge status: Home / Self Care | Payer: 59 | Source: Ambulatory Visit | Attending: Obstetrics and Gynecology | Admitting: Obstetrics and Gynecology

## 2020-05-17 ENCOUNTER — Other Ambulatory Visit: Payer: Self-pay

## 2020-05-17 ENCOUNTER — Telehealth: Payer: Self-pay | Admitting: Urgent Care

## 2020-05-17 DIAGNOSIS — Z01818 Encounter for other preprocedural examination: Secondary | ICD-10-CM | POA: Insufficient documentation

## 2020-05-17 NOTE — Patient Instructions (Signed)
Your procedure is scheduled on: Friday 05/27/20.  Report to THE FIRST FLOOR REGISTRATION DESK IN THE MEDICAL MALL ON THE MORNING OF SURGERY FIRST, THEN YOU WILL CHECK IN AT THE SURGERY INFORMATION DESK LOCATED OUTSIDE THE SAME DAY SURGERY DEPARTMENT LOCATED ON 2ND FLOOR MEDICAL MALL ENTRANCE.  To find out your arrival time please call [phone] between 1PM - 3PM on Thursday 05/26/20.   Remember: Instructions that are not followed completely may result in serious medical risk, up to and including death, or upon the discretion of your surgeon and anesthesiologist your surgery may need to be rescheduled.     __X__ 1. Do not eat food after midnight the night before your procedure.                 No gum chewing or hard candies. You may drink clear liquids up to 2 hours                 before you are scheduled to arrive for your surgery- DO NOT drink clear                 liquids within 2 hours of the start of your surgery.                 Clear Liquids include:  water, apple juice without pulp, clear carbohydrate                 drink such as Clearfast or Gatorade, Black Coffee or Tea (Do not add                 milk or creamer to coffee or tea).  __X__2.  On the morning of surgery brush your teeth with toothpaste and water, you may rinse your mouth with mouthwash if you wish.  Do not swallow any toothpaste or mouthwash.    __X__ 3.  No Alcohol for 24 hours before or after surgery.  __X__ 4.  Do Not Smoke or use e-cigarettes For 24 Hours Prior to Your Surgery.                 Do not use any chewable tobacco products for at least 6 hours prior to                 surgery.  __X__5.  Notify your doctor if there is any change in your medical condition      (cold, fever, infections).      Do NOT wear jewelry, make-up, hairpins, clips or nail polish. Do NOT wear lotions, powders, or perfumes.  Do NOT shave 48 hours prior to surgery. Men may shave face and neck. Do NOT bring valuables to the  hospital.     [facility] is not responsible for any belongings or valuables.   Contacts, dentures/partials or body piercings may not be worn into surgery. Bring a case for your contacts, glasses or hearing aids, a denture cup will be supplied.    Patients discharged the day of surgery will not be allowed to drive home.     __X__ Take these medicines the morning of surgery with A SIP OF WATER:     1. pantoprazole (PROTONIX)    BE SURE TO USE YOUR desmopressin (DDAVP NASAL) 90 MINUTES PRIOR TO YOUR PROCEDURE START TIME AS INSTRUCTED BY DR. Tasia Catchings.     __X__ Use CHG Soap as directed.  __X__ Stop Anti-inflammatories 7 days before surgery such as Advil, Ibuprofen, Motrin, BC or Goodies Powder, Naprosyn,  Naproxen, Aleve, Aspirin, Meloxicam. May take Tylenol and/or HYDROcodone-acetaminophen (NORCO/VICODIN) if needed for pain or discomfort.   __X__Do not start taking any new herbal supplements or vitamins prior to your procedure.     Wear comfortable clothing (specific to your surgery type) to the hospital.  Plan for stool softeners for home use; pain medications have a tendency to cause constipation. You can also help prevent constipation by eating foods high in fiber such as fruits and vegetables and drinking plenty of fluids as your diet allows.  After surgery, you can prevent lung complications by doing breathing exercises.Take deep breaths and cough every 1-2 hours. Your doctor may order a device called an Incentive Spirometer to help you take deep breaths.  Please call the [place] Department at [phone] if you have any questions about these instructions.

## 2020-05-17 NOTE — Progress Notes (Addendum)
  Perioperative Services Pre-Admission/Anesthesia Testing    Date: 05/17/20  Name: Sonia Webb MRN:   [id number]  Re: Hematology recommendations for surgery  Patient is scheduled for a BILATERAL LAPAROSCOPIC OVARIAN CYSTECTOMY on 05/27/2020 with Dr. Laverta Webb.  Patient has lab confirmed von Willebrand's disease.  She is followed by hematology Sonia Catchings, MD) in the [place].  Patient was last seen in the hematology clinic on 04/11/2020; notes reviewed.  Hematologist notes need for patient to utilize her intranasal DDAVP spray 90 minutes prior to her procedure to minimize her bleeding risk.  Additionally, hematologist is recommending that Humate-P be available in the event the patient develops intraoperative/postoperative bleeding.  This medication is not available on the [facility] campus, however can be obtained from [facility].  In efforts to safely approach patient's procedure and mitigate any perioperative complications (bleeding), I have reached out to OR administration to determine how to ensure that this medication is on the [facility] campus on the day of the patient's procedure.  Administration recommending that order be placed for this medication and our pharmacy be notified directly.  Of note, hematologist has spoken with inpatient pharmacist Sonia Webb) in the past regarding medication needs for this patient.  Per Dr. Collie Siad note, "Sonia Webb will look into getting medication to [facility] in case patient needs it".  I reached out to Dr. Tasia Webb regarding Humate-P dosing, and the following recommendations were received:  Reached out to primary attending surgeons office to communicate recommendations.  Dr. Collie Siad notes indicate that she has already spoken to surgeon about recommendations as well. I was asked by Dr. Ouida Webb to call his nurse to discuss. While attempting to contact office, nurse Sonia Webb) had surgery scheduler Sonia Webb) contact my office. Above information  communicated. Information to be sent in written form for MD to review; I will plan on sending a copy of this note. Once reviewed and clinical decision is made, the request is that surgeon Sonia Sills, MD) enter the appropriate orders so that Humate-P can be obtained and available for the patient's procedure on 05/27/2020.  ADDENDUM -- 05/20/2020 @ 0950 AM: Spoke with Dr. Ouida Webb on 05/17/2020 regarding the above. MD entered order for loading dose of Humate-P to be given PRN as per his discretion. Attempted to contact Sonia Webb in the pharmacy to make her aware and confirm that medication would be on hand. I was asked to let surgeon know if medication could not be obtained. Unable to touch base with Sonia Webb. Reached out to Sonia Webb (pharmacy AD) on the morning on 05/20/2020 and explained the situation and potential need for this medication. Per Sonia Webb has been obtained from [facility] and is on campus. Enough on hand for loading dose and one additional dose. I was advised that Sonia Webb has spoken with the pharmacy staff to let them know how to obtain additional medication from [facility] should expedited need arise for beyond what we currently have on campus. Appreciate expeditious and collaborative attention to this matter by our Sonia Webb.   Sonia Loh, MSN, APRN, FNP-C, [facility]  Peri-operative Services Nurse Practitioner Phone: [phone] 05/17/20 3:13 PM

## 2020-05-23 ENCOUNTER — Other Ambulatory Visit (HOSPITAL_BASED_OUTPATIENT_CLINIC_OR_DEPARTMENT_OTHER): Payer: Self-pay

## 2020-05-24 MED FILL — Norethindrone & Ethinyl Estradiol Tab 1 MG-35 MCG: ORAL | 63 days supply | Qty: 84 | Fill #0 | Status: AC

## 2020-05-25 ENCOUNTER — Encounter
Admission: RE | Admit: 2020-05-25 | Discharge: 2020-05-25 | Disposition: A | Discharge status: Home / Self Care | Payer: 59 | Source: Ambulatory Visit | Attending: Obstetrics and Gynecology | Admitting: Obstetrics and Gynecology

## 2020-05-25 ENCOUNTER — Other Ambulatory Visit: Payer: Self-pay

## 2020-05-25 DIAGNOSIS — Z01812 Encounter for preprocedural laboratory examination: Secondary | ICD-10-CM | POA: Insufficient documentation

## 2020-05-25 LAB — CBC
HCT: 38.4 % (ref 36.0–46.0)
Hemoglobin: 12.6 g/dL (ref 12.0–15.0)
MCH: 27.3 pg (ref 26.0–34.0)
MCHC: 32.8 g/dL (ref 30.0–36.0)
MCV: 83.1 fL (ref 80.0–100.0)
Platelets: 315 10*3/uL (ref 150–400)
RBC: 4.62 MIL/uL (ref 3.87–5.11)
RDW: 16.5 % — ABNORMAL HIGH (ref 11.5–15.5)
WBC: 4.3 10*3/uL (ref 4.0–10.5)
nRBC: 0 % (ref 0.0–0.2)

## 2020-05-25 LAB — BASIC METABOLIC PANEL
Anion gap: 6 (ref 5–15)
BUN: 6 mg/dL (ref 6–20)
CO2: 24 mmol/L (ref 22–32)
Calcium: 8.8 mg/dL — ABNORMAL LOW (ref 8.9–10.3)
Chloride: 107 mmol/L (ref 98–111)
Creatinine, Ser: 0.59 mg/dL (ref 0.44–1.00)
GFR, Estimated: >60 mL/min (ref >60)
Glucose, Bld: 118 mg/dL — ABNORMAL HIGH (ref 70–99)
Potassium: 3.9 mmol/L (ref 3.5–5.1)
Sodium: 137 mmol/L (ref 135–145)

## 2020-05-25 LAB — TYPE AND SCREEN
ABO/RH(D): O POS
Antibody Screen: NEGATIVE

## 2020-05-26 ENCOUNTER — Telehealth: Payer: Self-pay | Admitting: Oncology

## 2020-05-26 MED ORDER — ORAL CARE MOUTH RINSE: 15.0000 mL | Freq: Once | OROMUCOSAL | Status: AC

## 2020-05-26 MED ORDER — LACTATED RINGERS IV SOLN: INTRAVENOUS | Status: DC

## 2020-05-26 MED ORDER — ACETAMINOPHEN 500 MG PO TABS: 1000.0000 mg | ORAL_TABLET | ORAL | Status: AC

## 2020-05-26 MED ORDER — POVIDONE-IODINE 10 % EX SWAB: 2.0000 "application " | Freq: Once | CUTANEOUS | Status: DC

## 2020-05-26 MED ORDER — GABAPENTIN 300 MG PO CAPS: 300.0000 mg | ORAL_CAPSULE | ORAL | Status: AC

## 2020-05-26 MED ORDER — CHLORHEXIDINE GLUCONATE 0.12 % MT SOLN: 15.0000 mL | Freq: Once | OROMUCOSAL | Status: AC

## 2020-05-26 NOTE — Telephone Encounter (Signed)
Talked to patient over the phone. She has DDAVP and will follow instruction to administrate prior to the procedure.

## 2020-05-27 ENCOUNTER — Other Ambulatory Visit: Payer: Self-pay

## 2020-05-27 ENCOUNTER — Ambulatory Visit: Payer: 59 | Admitting: Urgent Care

## 2020-05-27 ENCOUNTER — Encounter: Payer: Self-pay | Admitting: Obstetrics and Gynecology

## 2020-05-27 ENCOUNTER — Ambulatory Visit
Admission: RE | Admit: 2020-05-27 | Discharge: 2020-05-27 | Disposition: A | Discharge status: Home / Self Care | Payer: 59 | Attending: Obstetrics and Gynecology | Admitting: Obstetrics and Gynecology

## 2020-05-27 ENCOUNTER — Encounter
Admission: RE | Disposition: A | Discharge status: Home / Self Care | Payer: Self-pay | Source: Home / Self Care | Attending: Obstetrics and Gynecology

## 2020-05-27 DIAGNOSIS — D680 Von Willebrand's disease: Secondary | ICD-10-CM | POA: Diagnosis not present

## 2020-05-27 DIAGNOSIS — Z886 Allergy status to analgesic agent status: Secondary | ICD-10-CM | POA: Insufficient documentation

## 2020-05-27 DIAGNOSIS — N83201 Unspecified ovarian cyst, right side: Secondary | ICD-10-CM | POA: Diagnosis not present

## 2020-05-27 DIAGNOSIS — Z820 Family history of epilepsy and other diseases of the nervous system: Secondary | ICD-10-CM | POA: Insufficient documentation

## 2020-05-27 DIAGNOSIS — N736 Female pelvic peritoneal adhesions (postinfective): Secondary | ICD-10-CM | POA: Diagnosis not present

## 2020-05-27 DIAGNOSIS — Z8249 Family history of ischemic heart disease and other diseases of the circulatory system: Secondary | ICD-10-CM | POA: Insufficient documentation

## 2020-05-27 DIAGNOSIS — Z806 Family history of leukemia: Secondary | ICD-10-CM | POA: Insufficient documentation

## 2020-05-27 DIAGNOSIS — N809 Endometriosis, unspecified: Secondary | ICD-10-CM | POA: Diagnosis not present

## 2020-05-27 DIAGNOSIS — N801 Endometriosis of ovary: Secondary | ICD-10-CM | POA: Insufficient documentation

## 2020-05-27 DIAGNOSIS — Z8042 Family history of malignant neoplasm of prostate: Secondary | ICD-10-CM | POA: Insufficient documentation

## 2020-05-27 DIAGNOSIS — Z833 Family history of diabetes mellitus: Secondary | ICD-10-CM | POA: Diagnosis not present

## 2020-05-27 DIAGNOSIS — Z791 Long term (current) use of non-steroidal anti-inflammatories (NSAID): Secondary | ICD-10-CM | POA: Diagnosis not present

## 2020-05-27 DIAGNOSIS — Z8261 Family history of arthritis: Secondary | ICD-10-CM | POA: Insufficient documentation

## 2020-05-27 DIAGNOSIS — N803 Endometriosis of pelvic peritoneum: Secondary | ICD-10-CM | POA: Diagnosis not present

## 2020-05-27 DIAGNOSIS — Z880 Allergy status to penicillin: Secondary | ICD-10-CM | POA: Diagnosis not present

## 2020-05-27 DIAGNOSIS — Z79899 Other long term (current) drug therapy: Secondary | ICD-10-CM | POA: Diagnosis not present

## 2020-05-27 DIAGNOSIS — G8929 Other chronic pain: Secondary | ICD-10-CM | POA: Diagnosis present

## 2020-05-27 DIAGNOSIS — R102 Pelvic and perineal pain: Secondary | ICD-10-CM | POA: Diagnosis not present

## 2020-05-27 DIAGNOSIS — K219 Gastro-esophageal reflux disease without esophagitis: Secondary | ICD-10-CM | POA: Insufficient documentation

## 2020-05-27 DIAGNOSIS — Z881 Allergy status to other antibiotic agents status: Secondary | ICD-10-CM | POA: Diagnosis not present

## 2020-05-27 DIAGNOSIS — Z8371 Family history of colonic polyps: Secondary | ICD-10-CM | POA: Insufficient documentation

## 2020-05-27 DIAGNOSIS — Z818 Family history of other mental and behavioral disorders: Secondary | ICD-10-CM | POA: Insufficient documentation

## 2020-05-27 DIAGNOSIS — Z8349 Family history of other endocrine, nutritional and metabolic diseases: Secondary | ICD-10-CM | POA: Diagnosis not present

## 2020-05-27 DIAGNOSIS — Z823 Family history of stroke: Secondary | ICD-10-CM | POA: Diagnosis not present

## 2020-05-27 HISTORY — PX: LAPAROSCOPIC OVARIAN CYSTECTOMY: SHX6248

## 2020-05-27 LAB — ABO/RH: ABO/RH(D): O POS

## 2020-05-27 LAB — POCT PREGNANCY, URINE: Preg Test, Ur: NEGATIVE

## 2020-05-27 SURGERY — EXCISION, CYST, OVARY, LAPAROSCOPIC
Anesthesia: General | Laterality: Bilateral

## 2020-05-27 MED ORDER — LIDOCAINE HCL (CARDIAC) PF 100 MG/5ML IV SOSY
PREFILLED_SYRINGE | INTRAVENOUS | Status: DC | PRN
  Administered 2020-05-27: 40 mg via INTRAVENOUS

## 2020-05-27 MED ORDER — KETAMINE HCL 50 MG/ML IJ SOLN
INTRAMUSCULAR | Status: DC | PRN
  Administered 2020-05-27: 25 mg via INTRAMUSCULAR

## 2020-05-27 MED ORDER — ONDANSETRON HCL 4 MG/2ML IJ SOLN
4.0000 mg | Freq: Once | INTRAMUSCULAR | Status: AC | PRN
  Administered 2020-05-27: 4 mg via INTRAVENOUS

## 2020-05-27 MED ORDER — HYDROCODONE-ACETAMINOPHEN 5-325 MG PO TABS
ORAL_TABLET | ORAL | Status: AC
  Filled 2020-05-27: qty 1

## 2020-05-27 MED ORDER — DEXAMETHASONE SODIUM PHOSPHATE 10 MG/ML IJ SOLN
INTRAMUSCULAR | Status: AC
  Filled 2020-05-27: qty 1

## 2020-05-27 MED ORDER — FENTANYL CITRATE (PF) 100 MCG/2ML IJ SOLN
25.0000 ug | INTRAMUSCULAR | Status: DC | PRN
  Administered 2020-05-27 (×2): 25 ug via INTRAVENOUS

## 2020-05-27 MED ORDER — KETAMINE HCL 50 MG/ML IJ SOLN
INTRAMUSCULAR | Status: AC
  Filled 2020-05-27: qty 1

## 2020-05-27 MED ORDER — BUPIVACAINE HCL (PF) 0.5 % IJ SOLN
INTRAMUSCULAR | Status: AC
  Filled 2020-05-27: qty 30

## 2020-05-27 MED ORDER — ACETAMINOPHEN 500 MG PO TABS
ORAL_TABLET | ORAL | Status: AC
  Administered 2020-05-27: 1000 mg via ORAL
  Filled 2020-05-27: qty 2

## 2020-05-27 MED ORDER — SEVOFLURANE IN SOLN
RESPIRATORY_TRACT | Status: AC
  Filled 2020-05-27: qty 250

## 2020-05-27 MED ORDER — MIDAZOLAM HCL 2 MG/2ML IJ SOLN
INTRAMUSCULAR | Status: AC
  Filled 2020-05-27: qty 2

## 2020-05-27 MED ORDER — MIDAZOLAM HCL 2 MG/2ML IJ SOLN
INTRAMUSCULAR | Status: DC | PRN
  Administered 2020-05-27: 2 mg via INTRAVENOUS

## 2020-05-27 MED ORDER — CHLORHEXIDINE GLUCONATE 0.12 % MT SOLN
OROMUCOSAL | Status: AC
  Administered 2020-05-27: 15 mL via OROMUCOSAL
  Filled 2020-05-27: qty 15

## 2020-05-27 MED ORDER — HYDROCODONE-ACETAMINOPHEN 5-325 MG PO TABS
1.0000 | ORAL_TABLET | ORAL | Status: DC | PRN
  Administered 2020-05-27: 1 via ORAL

## 2020-05-27 MED ORDER — DEXAMETHASONE SODIUM PHOSPHATE 10 MG/ML IJ SOLN
INTRAMUSCULAR | Status: DC | PRN
  Administered 2020-05-27: 5 mg via INTRAVENOUS

## 2020-05-27 MED ORDER — ONDANSETRON HCL 4 MG PO TABS: 4.0000 mg | ORAL_TABLET | Freq: Once | ORAL | Status: DC

## 2020-05-27 MED ORDER — SUCCINYLCHOLINE CHLORIDE 200 MG/10ML IV SOSY
PREFILLED_SYRINGE | INTRAVENOUS | Status: AC
  Filled 2020-05-27: qty 10

## 2020-05-27 MED ORDER — FENTANYL CITRATE (PF) 100 MCG/2ML IJ SOLN
INTRAMUSCULAR | Status: AC
  Administered 2020-05-27: 25 ug via INTRAVENOUS
  Filled 2020-05-27: qty 2

## 2020-05-27 MED ORDER — ANTIHEMOPHILIC FACTOR-VWF 250-600 UNITS IV SOLR: 80.0000 [IU]/kg | Freq: Once | INTRAVENOUS | Status: DC | PRN

## 2020-05-27 MED ORDER — FENTANYL CITRATE (PF) 100 MCG/2ML IJ SOLN
INTRAMUSCULAR | Status: AC
  Filled 2020-05-27: qty 2

## 2020-05-27 MED ORDER — ONDANSETRON HCL 4 MG/2ML IJ SOLN
INTRAMUSCULAR | Status: DC | PRN
  Administered 2020-05-27: 4 mg via INTRAVENOUS

## 2020-05-27 MED ORDER — ONDANSETRON HCL 4 MG/2ML IJ SOLN
INTRAMUSCULAR | Status: AC
  Filled 2020-05-27: qty 2

## 2020-05-27 MED ORDER — GABAPENTIN 300 MG PO CAPS
ORAL_CAPSULE | ORAL | Status: AC
  Administered 2020-05-27: 300 mg via ORAL
  Filled 2020-05-27: qty 1

## 2020-05-27 MED ORDER — DEXMEDETOMIDINE (PRECEDEX) IN NS 20 MCG/5ML (4 MCG/ML) IV SYRINGE
PREFILLED_SYRINGE | INTRAVENOUS | Status: DC | PRN
  Administered 2020-05-27 (×3): 8 ug via INTRAVENOUS

## 2020-05-27 MED ORDER — ROCURONIUM BROMIDE 100 MG/10ML IV SOLN
INTRAVENOUS | Status: DC | PRN
  Administered 2020-05-27: 20 mg via INTRAVENOUS
  Administered 2020-05-27: 50 mg via INTRAVENOUS
  Administered 2020-05-27: 10 mg via INTRAVENOUS

## 2020-05-27 MED ORDER — ROCURONIUM BROMIDE 10 MG/ML (PF) SYRINGE
PREFILLED_SYRINGE | INTRAVENOUS | Status: AC
  Filled 2020-05-27: qty 10

## 2020-05-27 MED ORDER — SUGAMMADEX SODIUM 200 MG/2ML IV SOLN
INTRAVENOUS | Status: DC | PRN
  Administered 2020-05-27: 200 mg via INTRAVENOUS

## 2020-05-27 MED ORDER — PROPOFOL 10 MG/ML IV BOLUS
INTRAVENOUS | Status: DC | PRN
  Administered 2020-05-27: 200 mg via INTRAVENOUS
  Administered 2020-05-27: 40 mg via INTRAVENOUS

## 2020-05-27 MED ORDER — HEMOSTATIC AGENTS (NO CHARGE) OPTIME
TOPICAL | Status: DC | PRN
  Administered 2020-05-27: 2 via TOPICAL

## 2020-05-27 MED ORDER — PROPOFOL 10 MG/ML IV BOLUS
INTRAVENOUS | Status: AC
  Filled 2020-05-27: qty 40

## 2020-05-27 MED ORDER — DEXMEDETOMIDINE (PRECEDEX) IN NS 20 MCG/5ML (4 MCG/ML) IV SYRINGE
PREFILLED_SYRINGE | INTRAVENOUS | Status: AC
  Filled 2020-05-27: qty 5

## 2020-05-27 MED ORDER — FENTANYL CITRATE (PF) 100 MCG/2ML IJ SOLN
INTRAMUSCULAR | Status: DC | PRN
  Administered 2020-05-27: 100 ug via INTRAVENOUS

## 2020-05-27 MED ORDER — GLYCOPYRROLATE 0.2 MG/ML IJ SOLN
INTRAMUSCULAR | Status: AC
  Filled 2020-05-27: qty 1

## 2020-05-27 MED ORDER — BUPIVACAINE HCL 0.5 % IJ SOLN
INTRAMUSCULAR | Status: DC | PRN
  Administered 2020-05-27: 9 mL

## 2020-05-27 MED ORDER — LIDOCAINE HCL (PF) 2 % IJ SOLN
INTRAMUSCULAR | Status: AC
  Filled 2020-05-27: qty 5

## 2020-05-27 MED ORDER — PROPOFOL 10 MG/ML IV BOLUS
INTRAVENOUS | Status: AC
  Filled 2020-05-27: qty 20

## 2020-05-27 SURGICAL SUPPLY — 40 items
APPLICATOR ARISTA FLEXITIP XL (MISCELLANEOUS) ×2 IMPLANT
BAG URINE DRAIN 2000ML AR STRL (UROLOGICAL SUPPLIES) ×2 IMPLANT
BLADE SURG SZ11 CARB STEEL (BLADE) ×2 IMPLANT
CATH ROBINSON RED A/P 16FR (CATHETERS) ×2 IMPLANT
CHLORAPREP W/TINT 26 (MISCELLANEOUS) ×2 IMPLANT
COVER WAND RF STERILE (DRAPES) ×2 IMPLANT
DRSG TEGADERM 2-3/8X2-3/4 SM (GAUZE/BANDAGES/DRESSINGS) ×6 IMPLANT
GLOVE SURG SYN 8.0 (GLOVE) ×16 IMPLANT
GOWN STRL REUS W/ TWL LRG LVL3 (GOWN DISPOSABLE) ×3 IMPLANT
GOWN STRL REUS W/ TWL XL LVL3 (GOWN DISPOSABLE) ×2 IMPLANT
GOWN STRL REUS W/TWL LRG LVL3 (GOWN DISPOSABLE) ×3
GOWN STRL REUS W/TWL XL LVL3 (GOWN DISPOSABLE) ×2
GRASPER SUT TROCAR 14GX15 (MISCELLANEOUS) ×2 IMPLANT
HEMOSTAT ARISTA ABSORB 3G PWDR (HEMOSTASIS) ×4 IMPLANT
IRRIGATION STRYKERFLOW (MISCELLANEOUS) ×1 IMPLANT
IRRIGATOR STRYKERFLOW (MISCELLANEOUS) ×2
IV NS 1000ML (IV SOLUTION) ×1
IV NS 1000ML BAXH (IV SOLUTION) ×1 IMPLANT
KIT PINK PAD W/HEAD ARE REST (MISCELLANEOUS) ×2
KIT PINK PAD W/HEAD ARM REST (MISCELLANEOUS) ×1 IMPLANT
KIT TURNOVER CYSTO (KITS) ×2 IMPLANT
LABEL OR SOLS (LABEL) ×2 IMPLANT
MANIFOLD NEPTUNE II (INSTRUMENTS) ×2 IMPLANT
NS IRRIG 500ML POUR BTL (IV SOLUTION) ×2 IMPLANT
PACK GYN LAPAROSCOPIC (MISCELLANEOUS) ×2 IMPLANT
PAD OB MATERNITY 4.3X12.25 (PERSONAL CARE ITEMS) ×2 IMPLANT
PAD PREP 24X41 OB/GYN DISP (PERSONAL CARE ITEMS) ×2 IMPLANT
POUCH SPECIMEN RETRIEVAL 10MM (ENDOMECHANICALS) IMPLANT
SET TUBE SMOKE EVAC HIGH FLOW (TUBING) ×2 IMPLANT
SHEARS HARMONIC ACE PLUS 36CM (ENDOMECHANICALS) ×2 IMPLANT
SLEEVE ENDOPATH XCEL 5M (ENDOMECHANICALS) ×4 IMPLANT
SPONGE GAUZE 2X2 8PLY STRL LF (GAUZE/BANDAGES/DRESSINGS) ×2 IMPLANT
STRIP CLOSURE SKIN 1/4X4 (GAUZE/BANDAGES/DRESSINGS) ×2 IMPLANT
SUT VIC AB 0 CT1 36 (SUTURE) ×2 IMPLANT
SUT VIC AB 2-0 UR6 27 (SUTURE) ×2 IMPLANT
SUT VIC AB 4-0 SH 27 (SUTURE) ×1
SUT VIC AB 4-0 SH 27XANBCTRL (SUTURE) ×1 IMPLANT
SWABSTK COMLB BENZOIN TINCTURE (MISCELLANEOUS) ×2 IMPLANT
TROCAR ENDO BLADELESS 11MM (ENDOMECHANICALS) ×2 IMPLANT
TROCAR XCEL NON-BLD 5MMX100MML (ENDOMECHANICALS) ×2 IMPLANT

## 2020-05-27 NOTE — Anesthesia Procedure Notes (Signed)
Procedure Name: Intubation Date/Time: 05/27/2020 7:38 AM Performed by: Lia Foyer, CRNA Pre-anesthesia Checklist: Patient identified, Emergency Drugs available, Suction available and Patient being monitored Patient Re-evaluated:Patient Re-evaluated prior to induction Oxygen Delivery Method: Circle system utilized Preoxygenation: Pre-oxygenation with 100% oxygen Induction Type: IV induction Ventilation: Mask ventilation without difficulty Laryngoscope Size: Mac and 3 Grade View: Grade I Tube type: Oral Tube size: 7.0 mm Number of attempts: 1 Airway Equipment and Method: Stylet and Oral airway Placement Confirmation: ETT inserted through vocal cords under direct vision,  positive ETCO2 and breath sounds checked- equal and bilateral Secured at: 22 cm Tube secured with: Tape Dental Injury: Teeth and Oropharynx as per pre-operative assessment

## 2020-05-27 NOTE — Discharge Instructions (Signed)

## 2020-05-27 NOTE — Progress Notes (Signed)
Pr ready for l/s right ovarian cystectomy  Excision of endometriosis if found .  Pt has taken  Her DDAVP and Humate is in Pharmacy if needed .  All question answered . Labs reviewed .proceed

## 2020-05-27 NOTE — Transfer of Care (Signed)
Immediate Anesthesia Transfer of Care Note  Patient: Sonia Webb  Procedure(s) Performed: LAPAROSCOPIC OVARIAN CYSTECTOMY (Bilateral )  Patient Location: PACU  Anesthesia Type:General  Level of Consciousness: awake and alert   Airway & Oxygen Therapy: Patient Spontanous Breathing and Patient connected to face mask oxygen  Post-op Assessment: Report given to RN and Post -op Vital signs reviewed and stable  Post vital signs: Reviewed and stable  Last Vitals:  Vitals Value Taken Time  BP 129/68 05/27/20 0953  Temp    Pulse 97 05/27/20 0957  Resp 13 05/27/20 0957  SpO2 100 % 05/27/20 0957  Vitals shown include unvalidated device data.  Last Pain:  Vitals:   05/27/20 0624  TempSrc: Temporal  PainSc: 4          Complications: No complications documented.

## 2020-05-27 NOTE — Anesthesia Preprocedure Evaluation (Signed)
Anesthesia Evaluation  Patient identified by MRN, date of birth, ID band Patient awake    Reviewed: Allergy & Precautions, H&P , NPO status , Patient's Chart, lab work & pertinent test results, reviewed documented beta blocker date and time   Airway Mallampati: II  TM Distance: >3 FB Neck ROM: full    Dental  (+) Teeth Intact   Pulmonary neg pulmonary ROS,    Pulmonary exam normal        Cardiovascular negative cardio ROS Normal cardiovascular exam Rhythm:regular Rate:Normal     Neuro/Psych  Headaches, negative psych ROS   GI/Hepatic Neg liver ROS, GERD  Medicated,  Endo/Other  negative endocrine ROS  Renal/GU negative Renal ROS  negative genitourinary   Musculoskeletal   Abdominal   Peds  Hematology  (+) Blood dyscrasia, anemia ,   Anesthesia Other Findings Past Medical History: 06/09/2014: Episodic tension-type headache, not intractable 07/22/2018: Family history of diabetes mellitus (DM)     Comment:  Formatting of this note might be different from the               original. Mother 07/22/2018: Gastroesophageal reflux disease without esophagitis 11/27/2012: Iron deficiency anemia due to chronic blood loss 07/24/2019: Pernicious anemia     Comment:  Formatting of this note might be different from the               original. 153, 2020 07/22/2018: Type 2B von Willebrand's disease ([place]) Past Surgical History: No date: TONSILLECTOMY 2010: WISDOM TOOTH EXTRACTION   Reproductive/Obstetrics negative OB ROS                             Anesthesia Physical Anesthesia Plan  ASA: II  Anesthesia Plan: General ETT   Post-op Pain Management:    Induction:   PONV Risk Score and Plan: 4 or greater  Airway Management Planned:   Additional Equipment:   Intra-op Plan:   Post-operative Plan:   Informed Consent: I have reviewed the patients History and Physical, chart, labs and discussed the  procedure including the risks, benefits and alternatives for the proposed anesthesia with the patient or authorized representative who has indicated his/her understanding and acceptance.     Dental Advisory Given  Plan Discussed with: CRNA  Anesthesia Plan Comments:         Anesthesia Quick Evaluation

## 2020-05-27 NOTE — Brief Op Note (Signed)
05/27/2020  9:44 AM  PATIENT:  Sonia Webb  30 y.o. female  PRE-OPERATIVE DIAGNOSIS:  pelvic pain, endometriosis  POST-OPERATIVE DIAGNOSIS:  pelvic pain, endometriosis Right ovarian endometrioma , pelvic adhesions  PROCEDURE:  Laparoscopic lysis of adhesions, , excision of right ovarian endometrioma  Excision of pelvic endometriosis SURGEON:  Surgeon(s) and Role:    * Janisa Labus, Gwen Her, MD - Primary    * Benjaman Kindler, MD - Assisting  PHYSICIAN ASSISTANT: PA student Carlis Abbott  ASSISTANTS: none   ANESTHESIA:   general  EBL:  80 mL iof 800 cc OU 100 cc  BLOOD ADMINISTERED:none  DRAINS: none   LOCAL MEDICATIONS USED:  MARCAINE     SPECIMEN:  Source of Specimen:  multiple biopsies of pelvic endomeriosis . Right ovarian endometrioma cust wall  DISPOSITION OF SPECIMEN:  PATHOLOGY  COUNTS:  YES  TOURNIQUET:  * No tourniquets in log *  DICTATION: .Other Dictation: Dictation Number verbal  PLAN OF CARE: Admit to inpatient   PATIENT DISPOSITION:  PACU - hemodynamically stable.   Delay start of Pharmacological VTE agent (>24hrs) due to surgical blood loss or risk of bleeding: not applicable

## 2020-05-28 NOTE — Anesthesia Postprocedure Evaluation (Signed)
Anesthesia Post Note  Patient: Sonia Webb  Procedure(s) Performed: LAPAROSCOPIC OVARIAN CYSTECTOMY (Bilateral )  Patient location during evaluation: PACU Anesthesia Type: General Level of consciousness: awake and alert Pain management: pain level controlled Vital Signs Assessment: post-procedure vital signs reviewed and stable Respiratory status: spontaneous breathing, nonlabored ventilation, respiratory function stable and patient connected to nasal cannula oxygen Cardiovascular status: blood pressure returned to baseline and stable Postop Assessment: no apparent nausea or vomiting Anesthetic complications: no   No complications documented.   Last Vitals:  Vitals:   05/27/20 1030 05/27/20 1047  BP: 116/64 (!) 127/57  Pulse: 94 85  Resp: 18 15  Temp: 36.8 C 36.7 C  SpO2: 99% 100%    Last Pain:  Vitals:   05/27/20 1047  TempSrc: Temporal  PainSc: [place] Zyad Boomer

## 2020-05-28 NOTE — Op Note (Signed)
NAME: Sonia Webb, Sonia Webb MEDICAL RECORD NO: [id number] ACCOUNT NO: [id number] DATE OF BIRTH: 1990-01-26 FACILITY: [facility] LOCATION: [facility] PHYSICIAN: Boykin Nearing, MD  Operative Report   DATE OF PROCEDURE: 05/27/2020   PREOPERATIVE DIAGNOSIS:  Chronic pelvic pain.  POSTOPERATIVE DIAGNOSES:   1.  Stage IV endometriosis. 2.  Pelvic adhesions. 3.  Right ovarian endometrioma.  PROCEDURE:   1.  Laparoscopic lysis of adhesions. 2.  Excision of right ovarian endometrioma. 3.  Excision of pelvic endometriosis. 4.  Left ovarian cystotomy.  ANESTHESIA:  General endotracheal anesthesia.  SURGEON:  Boykin Nearing, MD.  FIRST ASSISTANT:  Leafy Ro, MD  SECOND ASSISTANT:  PA student Carlis Abbott.    INDICATION:  A 30 year old gravida 0 patient with a 26-month history of chronic pelvic pain, right side greater than the left, requiring narcotics for pain control.  Ultrasound previously demonstrated a persistent right ovarian cyst consistent with  endometriosis and endometrioma.  FINDINGS:   1.  Multiple pelvic adhesions with scarring of both ovaries to the ovarian fossa and partial obliteration of the posterior cul-de-sac with filmy adhesions.   2.  Multiple lesions in the ovarian fossa and uterosacral ligaments consistent with endometriosis.   3.  Right ovarian endometrioma 3 cm x 3 cm.  PROCEDURE: After adequate general endotracheal anesthesia, the patient was placed in dorsal supine position with the legs in the Ruth stirrups.  The patient's abdomen, perineum and vagina were prepped and draped in normal sterile fashion.  Timeout was  performed.  A weighted speculum was placed in the vagina and the anterior cervix was grasped with a single tooth tenaculum and a Kahn cannula was placed into the endocervical canal to be used for uterine manipulation during the procedure.  An  infraumbilical incision was made, 5 mm in size after injecting with 0.5% Marcaine.  Gloves and gown  were changed.  Attention was directed to the patient's abdomen where a 5 mm infraumbilical incision was made after injecting with 0.5% Marcaine.  A 5 mm  laparoscope was then entered into the abdominal cavity under direct visualization with the Optiview cannula.  The patient's abdomen was insufflated with carbon dioxide.  The patient was placed in Trendelenburg.  A second port placement was placed in the  left lower quadrant, 3 cm medial to the left anterior iliac spine and a 5 mm trocar was advanced under direct visualization.  A similar procedure was repeated on the right side, again 3 cm medial to the right anterior iliac spine and a 5 mm trocar was  advanced.  Initial impression revealed adhesed ovaries bilaterally into the ovarian fossa with multiple adhesions, scarring the ovaries into the fossa.  The Harmonic scalpel was then brought up to the operative field.  Extensive adhesiolysis was then performed  freeing all adhesions in the posterior cul-de-sac and in the ovarian fossae.  Attention was then directed to the patient's right ovary, which was opened in the midportion and thick fluid consistent with a chocolate cyst, which was consistent with an  endometrioma then was evacuated.  The ovary cortex was opened approximately 3 cm and a right ovarian cystectomy with removal of the endometrioma was then performed.  This was accomplished with traction and countertraction with the graspers and ultimately  the cyst wall was excised and removed.  Specimen will be sent for pathologic review.  There were several deep powder burn areas on the uterosacral ligaments bilaterally.  They were excised with Harmonic scalpel.  Ultimately, the patient had multiple  excisional biopsies  and samples to be sent to pathology to confirm endometriosis.  Given the deep infiltration of the endometriosis with the endometrioma and the adhesions, her endometriosis is consistent with stage IV classification.  Ultimately, the  left  ovary was then incised, again looking for a left ovarian endometrioma, which was not found, a deep portion of the ovary was opened with no fluid or chocolate fluid removed.  Therefore, given the patient's history of von Willebrand's disease,  surgeons did not do extensive dissection on this left ovary given that it did not appear to have an endometrioma.  At the end of dissection, the ovaries were free from adhesions in the ovarian fossa.  The uterus was mobile.  No adhesions in the posterior  cul-de-sac.  Upper abdomen appeared normal and Arista was used extensively within the right ovarian cystectomy site as well as on the left ovary.  Good hemostasis was noted with the intra-abdominal pressure lowered to 7 mmHg.  No active bleeding noted.   The patient's abdomen was then deflated and all 3 incisions were closed with interrupted 4-0 Vicryl suture.  A single-tooth tenaculum was removed from the cervix.  Good hemostasis was noted and the procedure was terminated.  Sterile dressings applied on  the incisions.  The patient was taken to recovery room in good condition.   ESTIMATED BLOOD LOSS:  80 mL  INTRAOPERATIVE FLUIDS:  800 mL  URINE OUTPUT:  100 mL, with catheterization prior to commencement of the case.  There were no complications.  She was taken to recovery room in good condition.   [email or web address] D: 05/27/2020 10:38:01 am T: 05/28/2020 4:34:00 am  JOB: [id number]/ [id number]

## 2020-05-29 ENCOUNTER — Encounter: Payer: Self-pay | Admitting: Obstetrics and Gynecology

## 2020-05-30 ENCOUNTER — Other Ambulatory Visit: Payer: Self-pay

## 2020-05-30 LAB — SURGICAL PATHOLOGY

## 2020-06-01 DIAGNOSIS — N809 Endometriosis, unspecified: Secondary | ICD-10-CM | POA: Diagnosis not present

## 2020-06-02 DIAGNOSIS — R102 Pelvic and perineal pain: Secondary | ICD-10-CM | POA: Diagnosis not present

## 2020-06-02 DIAGNOSIS — N809 Endometriosis, unspecified: Secondary | ICD-10-CM | POA: Diagnosis not present

## 2020-06-23 DIAGNOSIS — N809 Endometriosis, unspecified: Secondary | ICD-10-CM | POA: Diagnosis not present

## 2020-07-27 MED FILL — Norethindrone & Ethinyl Estradiol Tab 1 MG-35 MCG: ORAL | 63 days supply | Qty: 84 | Fill #1 | Status: AC

## 2020-07-29 ENCOUNTER — Encounter: Payer: Self-pay | Admitting: Oncology

## 2020-07-29 ENCOUNTER — Other Ambulatory Visit: Payer: Self-pay

## 2020-08-02 ENCOUNTER — Encounter: Payer: Self-pay | Admitting: Oncology

## 2020-08-02 ENCOUNTER — Other Ambulatory Visit: Payer: Self-pay

## 2020-08-02 MED ORDER — ALYACEN 1/35 1-35 MG-MCG PO TABS
ORAL_TABLET | ORAL | 6 refills | Status: DC
  Filled 2020-08-02 – 2020-12-01 (×2): qty 56, 42d supply, fill #0
  Filled 2021-01-09: qty 56, 42d supply, fill #1

## 2020-08-22 ENCOUNTER — Other Ambulatory Visit: Payer: Self-pay

## 2020-09-28 MED FILL — Norethindrone & Ethinyl Estradiol Tab 1 MG-35 MCG: ORAL | 63 days supply | Qty: 84 | Fill #2 | Status: AC

## 2020-09-29 ENCOUNTER — Encounter: Payer: Self-pay | Admitting: Oncology

## 2020-09-29 ENCOUNTER — Other Ambulatory Visit: Payer: Self-pay

## 2020-09-30 ENCOUNTER — Other Ambulatory Visit: Payer: Self-pay

## 2020-10-27 ENCOUNTER — Ambulatory Visit
Admission: RE | Admit: 2020-10-27 | Discharge: 2020-10-27 | Disposition: A | Discharge status: Home / Self Care | Payer: BC Managed Care – PPO | Source: Ambulatory Visit | Attending: Family Medicine | Admitting: Family Medicine

## 2020-10-27 ENCOUNTER — Other Ambulatory Visit: Payer: Self-pay | Admitting: Family Medicine

## 2020-10-27 ENCOUNTER — Encounter: Payer: Self-pay | Admitting: Oncology

## 2020-10-27 ENCOUNTER — Other Ambulatory Visit: Payer: Self-pay

## 2020-10-27 DIAGNOSIS — R000 Tachycardia, unspecified: Secondary | ICD-10-CM | POA: Insufficient documentation

## 2020-10-27 MED ORDER — IOHEXOL 350 MG/ML SOLN
75.0000 mL | Freq: Once | INTRAVENOUS | Status: AC | PRN
  Administered 2020-10-27: 75 mL via INTRAVENOUS

## 2020-12-01 ENCOUNTER — Other Ambulatory Visit: Payer: Self-pay

## 2021-01-09 ENCOUNTER — Other Ambulatory Visit: Payer: Self-pay

## 2021-01-10 ENCOUNTER — Other Ambulatory Visit: Payer: Self-pay

## 2021-02-18 MED FILL — Norethindrone & Ethinyl Estradiol Tab 1 MG-35 MCG: ORAL | 63 days supply | Qty: 84 | Fill #3 | Status: AC

## 2021-02-19 IMAGING — CT CT RENAL STONE PROTOCOL
2 of 4 series · 16 of 46 positions shown, 18 images · non-contrast
Comparison: 11/02/2004

CLINICAL DATA: Right-sided flank pain and dysuria for 2 weeks.
Nausea and vomiting.

EXAM:
CT ABDOMEN AND PELVIS WITHOUT CONTRAST
TECHNIQUE: Multidetector CT imaging of the abdomen and pelvis was performed
following the standard protocol without IV contrast.

[Series 2: renal stone 5.00 · axial · 0.88mm/px · z∈[-1542,-1112]mm · 13 of 94 slices shown, 15 images]
[im 4/94  soft-tissue]
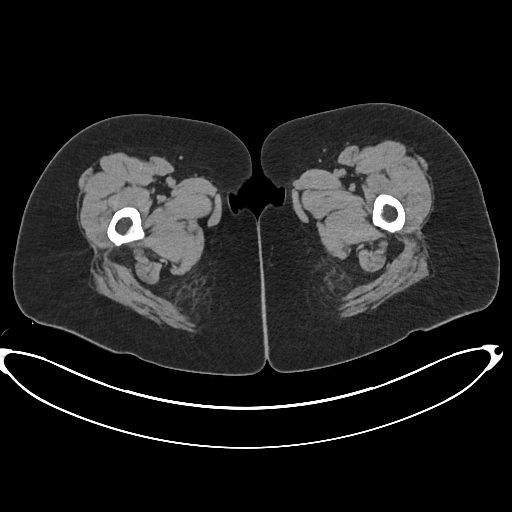
[im 4/94  bone]
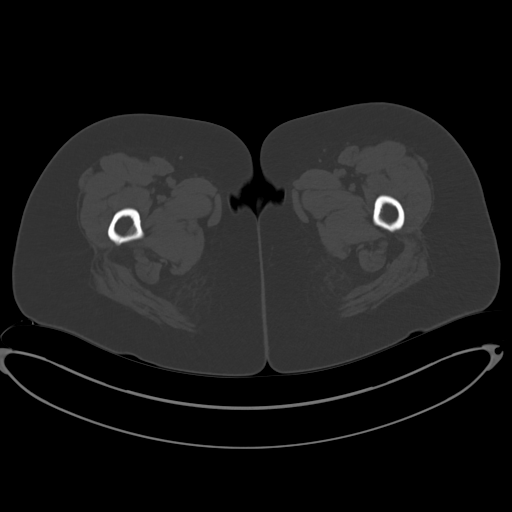
[im 12/94  soft-tissue]
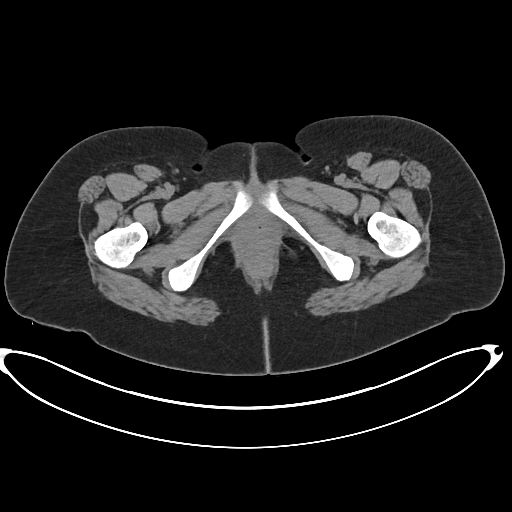
[im 20/94  soft-tissue]
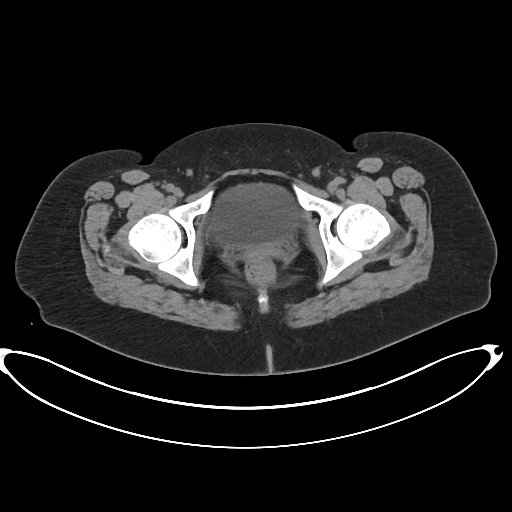
[im 28/94  soft-tissue]
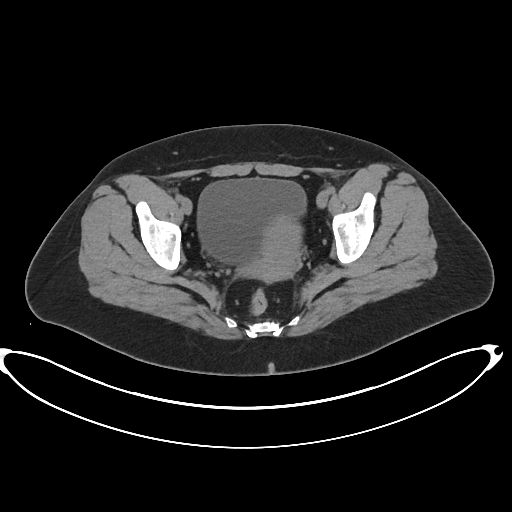
[im 32/94  soft-tissue]
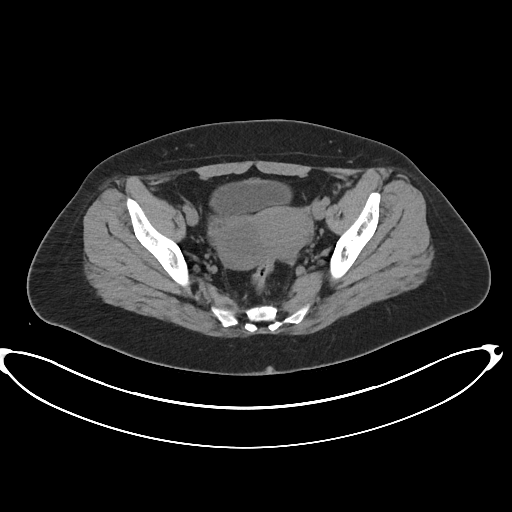
[im 39/94  soft-tissue]
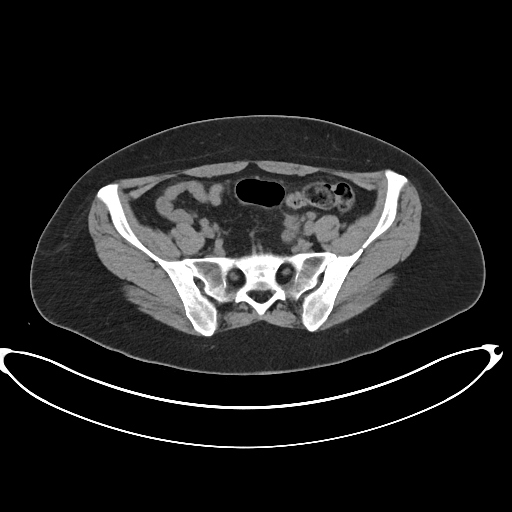
[im 47/94  soft-tissue]
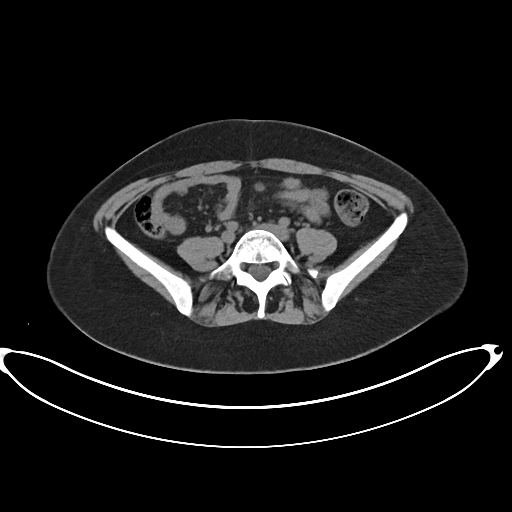
[im 55/94  soft-tissue]
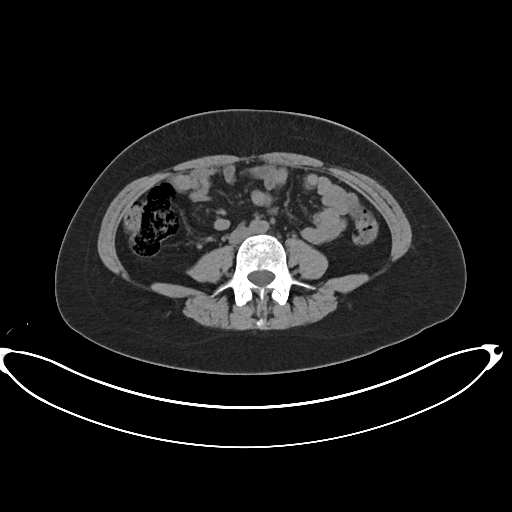
[im 63/94  soft-tissue]
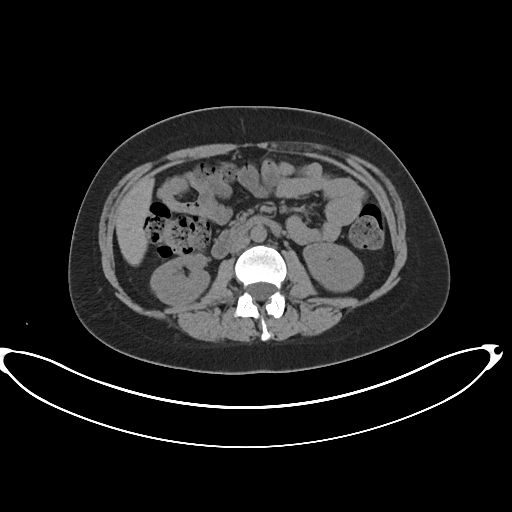
[im 63/94  bone]
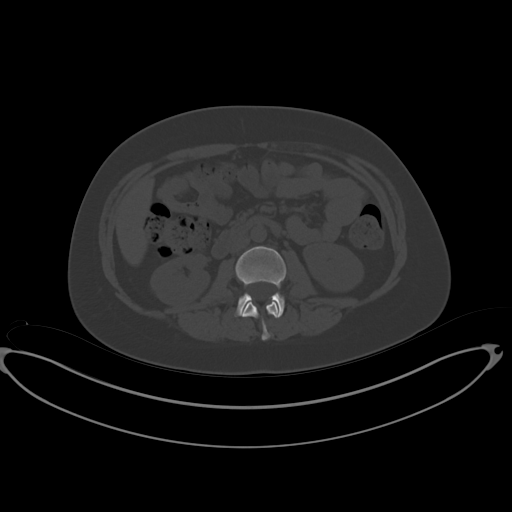
[im 66/94  soft-tissue]
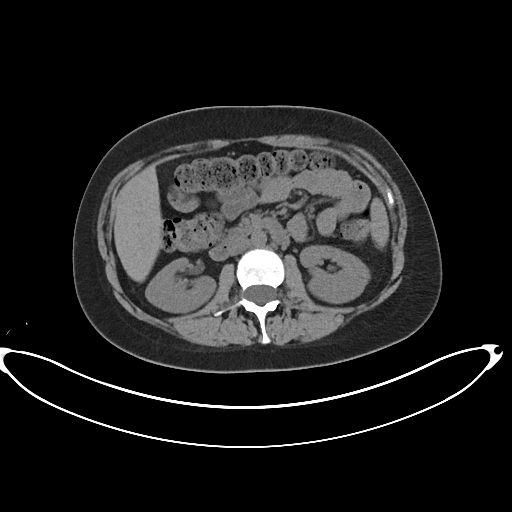
[im 74/94  soft-tissue]
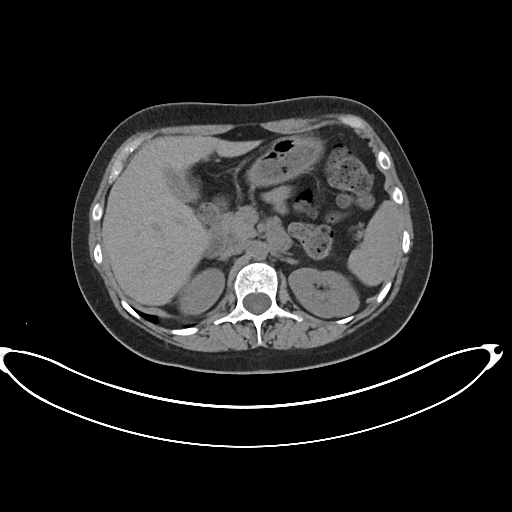
[im 82/94  soft-tissue]
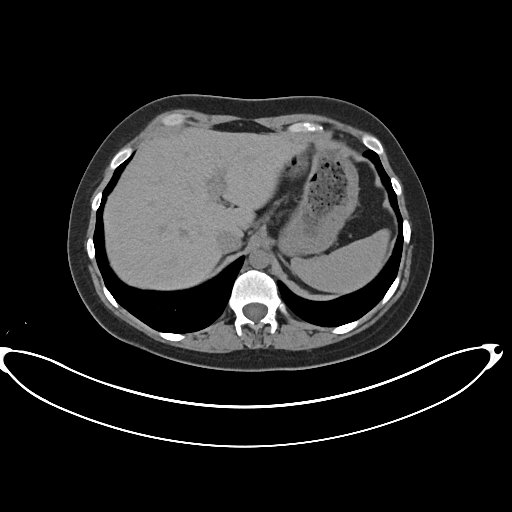
[im 90/94  soft-tissue]
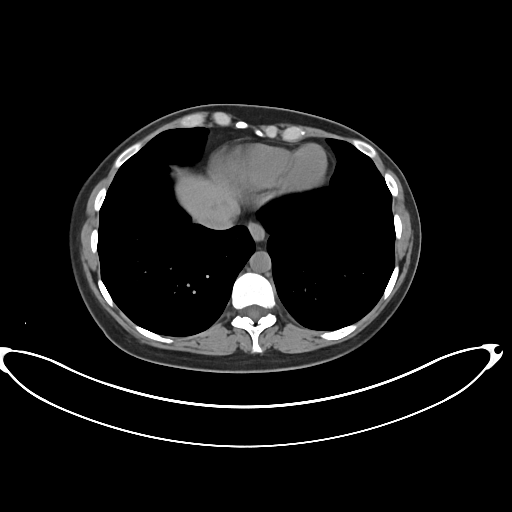

[Series 4: renal stone 2.00 cor · coronal · 0.86mm/px · 3 of 127 slices shown]
[im 43/127  soft-tissue]
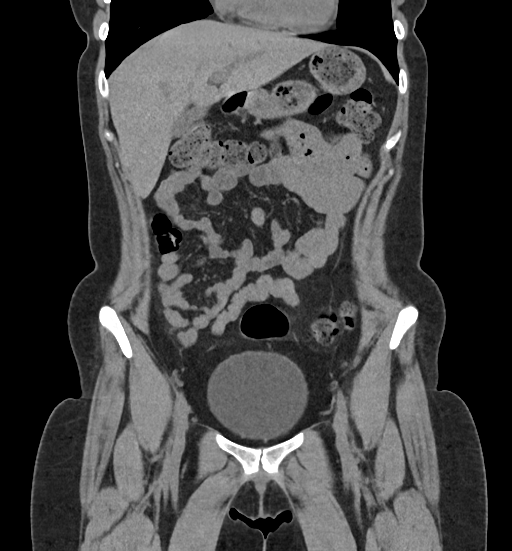
[im 57/127  soft-tissue]
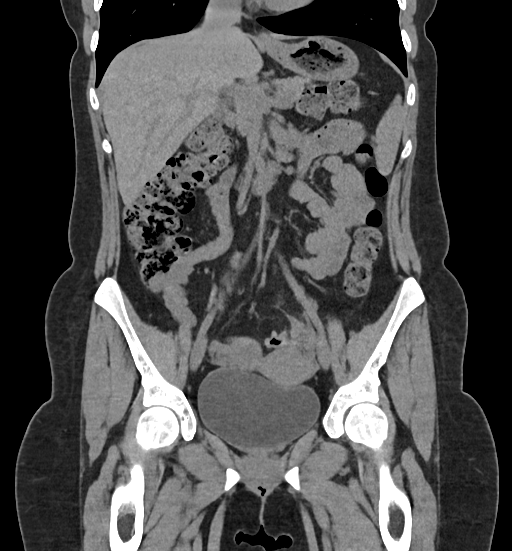
[im 71/127  soft-tissue]
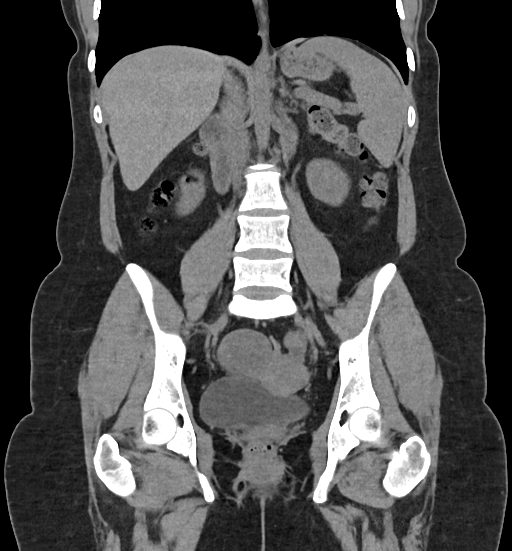

[16 of 46 positions shown; findings below may reference images not displayed]

FINDINGS: Lower chest: No acute findings.

Hepatobiliary: No mass visualized on this unenhanced exam.
Gallbladder is unremarkable. No evidence of biliary ductal
dilatation.

Pancreas: No mass or inflammatory process visualized on this
unenhanced exam.

Spleen:  Within normal limits in size.

Adrenals/Urinary tract: A tiny 1-2 mm calculus is seen in the upper
pole the left kidney. No evidence of ureteral calculi or
hydronephrosis. Unremarkable unopacified urinary bladder.

Stomach/Bowel: No evidence of obstruction, inflammatory process, or
abnormal fluid collections. Normal appendix visualized.

Vascular/Lymphatic: No pathologically enlarged lymph nodes
identified. No evidence of abdominal aortic aneurysm.

Reproductive: Normal appearance of uterus. A cystic lesion is seen
in the right adnexa which measures 5.3 x 4.7 cm assessment of this
lesion is limited on this unenhanced exam. No other pelvic masses
identified. No evidence of inflammatory process or abnormal fluid
collections.

Other:  None.

Musculoskeletal:  No suspicious bone lesions identified.
IMPRESSION: Tiny nonobstructing left renal calculus. No evidence of ureteral
calculi or hydronephrosis.

5.3 cm cystic lesion in right adnexa, which is not adequately
characterized on this unenhanced exam. Prompt pelvic ultrasound is
recommended for further characterization. This recommendation
follows ACR consensus guidelines: White Paper of the ACR Incidental
Findings Committee II on Adnexal Findings. [HOSPITAL]

## 2021-02-20 ENCOUNTER — Other Ambulatory Visit: Payer: Self-pay

## 2021-02-21 ENCOUNTER — Encounter: Payer: Self-pay | Admitting: Oncology

## 2021-02-26 ENCOUNTER — Encounter: Payer: Self-pay | Admitting: Oncology

## 2021-02-27 ENCOUNTER — Encounter: Payer: Self-pay | Admitting: Oncology

## 2021-02-27 ENCOUNTER — Other Ambulatory Visit: Payer: Self-pay

## 2021-02-27 ENCOUNTER — Ambulatory Visit (INDEPENDENT_AMBULATORY_CARE_PROVIDER_SITE_OTHER): Payer: BC Managed Care – PPO | Admitting: Certified Nurse Midwife

## 2021-02-27 ENCOUNTER — Other Ambulatory Visit (HOSPITAL_COMMUNITY)
Admission: RE | Admit: 2021-02-27 | Discharge: 2021-02-27 | Disposition: A | Discharge status: Home / Self Care | Payer: BC Managed Care – PPO | Source: Ambulatory Visit | Attending: Certified Nurse Midwife | Admitting: Certified Nurse Midwife

## 2021-02-27 ENCOUNTER — Encounter: Payer: Self-pay | Admitting: Certified Nurse Midwife

## 2021-02-27 VITALS — BP 118/80 | HR 90 | Ht 67.0 in | Wt 185.5 lb

## 2021-02-27 DIAGNOSIS — Z124 Encounter for screening for malignant neoplasm of cervix: Secondary | ICD-10-CM | POA: Insufficient documentation

## 2021-02-27 DIAGNOSIS — Z01419 Encounter for gynecological examination (general) (routine) without abnormal findings: Secondary | ICD-10-CM

## 2021-02-27 DIAGNOSIS — Z113 Encounter for screening for infections with a predominantly sexual mode of transmission: Secondary | ICD-10-CM

## 2021-02-27 DIAGNOSIS — R102 Pelvic and perineal pain: Secondary | ICD-10-CM | POA: Diagnosis not present

## 2021-02-27 MED ORDER — ALYACEN 1/35 1-35 MG-MCG PO TABS: ORAL_TABLET | ORAL | 6 refills | Status: DC

## 2021-02-27 NOTE — Progress Notes (Signed)
GYNECOLOGY ANNUAL PREVENTATIVE CARE ENCOUNTER NOTE  History:     Sonia Webb is a 31 y.o. No obstetric history on file. female here for a routine annual gynecologic exam.  Current complaints: right and left lower quadrant pain. History of ovarian cyst.    Denies abnormal vaginal bleeding, discharge, pelvic pain, problems with intercourse or other gynecologic concerns.     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   Gynecologic History No LMP recorded (lmp unknown). (Menstrual status: Oral contraceptives). Contraception: OCP (estrogen/progesterone) Last Pap: its been a while. Results were: normal per pt Last mammogram: n/a.   Upstream - 02/27/21 0858       Pregnancy Intention Screening   Does the patient want to become pregnant in the next year? No    Does the patient's partner want to become pregnant in the next year? No    Would the patient like to discuss contraceptive options today? No            The pregnancy intention screening data noted above was reviewed. Potential methods of contraception were discussed. The patient elected to proceed with ocp.  Obstetric History OB History  No obstetric history on file.    Past Medical History:  Diagnosis Date   Episodic tension-type headache, not intractable 06/09/2014   Family history of diabetes mellitus (DM) 07/22/2018   Formatting of this note might be different from the original. Mother   Gastroesophageal reflux disease without esophagitis 07/22/2018   Iron deficiency anemia due to chronic blood loss 11/27/2012   Pernicious anemia 07/24/2019   Formatting of this note might be different from the original. 153, 2020   Type 2B von Willebrand's disease ([place]) 07/22/2018    Past Surgical History:  Procedure Laterality Date   LAPAROSCOPIC OVARIAN CYSTECTOMY Bilateral 05/27/2020    Procedure: LAPAROSCOPIC OVARIAN CYSTECTOMY;  Surgeon: Boykin Nearing, MD;  Location: [facility] ORS;  Service: Gynecology;  Laterality: Bilateral;   TONSILLECTOMY     WISDOM TOOTH EXTRACTION  2010    Current Outpatient Medications on File Prior to Visit  Medication Sig Dispense Refill   acetaminophen (TYLENOL) 500 MG tablet Take 1,000 mg by mouth every 6 (six) hours as needed for moderate pain.     desmopressin (DDAVP NASAL) 0.01 % solution One spray in each nostril every 24-48 hours as needed for bleeding and as directed 60-90 minutes prior to procedures. Max 2 doses in one week. 5 mL 1   desmopressin (STIMATE) 1.5 MG/ML SOLN Place 1 spray into both nostrils once daily as needed One spray in each nostril every 24-48 hours prn bleeding and as directed 60-90 minutes prior to procedures. Max 2 doses in one week. 2.5 mL 1   ferrous gluconate (FERGON) 324 MG tablet Take 1 tablet (324 mg total) by mouth daily with breakfast 90 tablet 3   ferrous sulfate 325 (65 FE) MG tablet Take 650 mg by mouth daily.     gabapentin (NEURONTIN) 300 MG capsule Take 1 capsule (300 mg total) by mouth nightly for 10 days 10 capsule 11   pantoprazole (PROTONIX) 40 MG tablet Take 40 mg by mouth daily.      polyethylene glycol powder (GLYCOLAX/MIRALAX) 17 GM/SCOOP powder Take 17 g by mouth daily as needed for moderate constipation.     tranexamic acid (LYSTEDA) 650 MG TABS tablet Take 650 mg by mouth every  8 (eight) hours as needed (bleeding).     Current Facility-Administered Medications on File Prior to Visit  Medication Dose Route Frequency Provider Last Rate Last Admin   betamethasone acetate-betamethasone sodium phosphate (CELESTONE) injection 3 mg  3 mg Intramuscular Once Daylene Katayama M, DPM       betamethasone acetate-betamethasone sodium phosphate (CELESTONE) injection 3 mg  3 mg Intramuscular Once Edrick Kins, DPM        Allergies  Allergen Reactions   Lamotrigine Rash   Penicillins Anaphylaxis    Aspirin Other (See Comments)    ASA contraindicated with bleeding disorder   Nsaids Other (See Comments)    NSAIDS contraindicated with bleeding disorder.   Divalproex Sodium Other (See Comments)    shakes   Ibuprofen Other (See Comments)    Von Willibrand   Valproic Acid Other (See Comments)    Pt has the shakes   Cephalosporins Rash    Social History:  reports that she has never smoked. She has never used smokeless tobacco. She reports that she does not drink alcohol and does not use drugs.  Family History  Problem Relation Age of Onset   Diabetes Mother    Hypertension Mother    Prostate cancer Father    Hypertension Father    Bone cancer Maternal Grandfather     The following portions of the patient's history were reviewed and updated as appropriate: allergies, current medications, past family history, past medical history, past social history, past surgical history and problem list.  Review of Systems Pertinent items noted in HPI and remainder of comprehensive ROS otherwise negative.  Physical Exam:  BP 118/80    Pulse 90    Ht 5\' 7"  (1.702 m)    Wt 185 lb 8 oz (84.1 kg)    LMP  (LMP Unknown)    BMI 29.05 kg/m  CONSTITUTIONAL: Well-developed, well-nourished female in no acute distress.  HENT:  Normocephalic, atraumatic, External right and left ear normal. Oropharynx is clear and moist EYES: Conjunctivae and EOM are normal. Pupils are equal, round, and reactive to light. No scleral icterus.  NECK: Normal range of motion, supple, no masses.  Normal thyroid.  SKIN: Skin is warm and dry. No rash noted. Not diaphoretic. No erythema. No pallor. MUSCULOSKELETAL: Normal range of motion. No tenderness.  No cyanosis, clubbing, or edema.  2+ distal pulses. NEUROLOGIC: Alert and oriented to person, place, and time. Normal reflexes, muscle tone coordination.  PSYCHIATRIC: Normal mood and affect. Normal behavior. Normal judgment and thought content. CARDIOVASCULAR: Normal heart rate  noted, regular rhythm RESPIRATORY: Clear to auscultation bilaterally. Effort and breath sounds normal, no problems with respiration noted. BREASTS: Symmetric in size. No masses, tenderness, skin changes, nipple drainage, or lymphadenopathy bilaterally.  ABDOMEN: Soft, no distention noted.  No tenderness, rebound or guarding.  PELVIC: Normal appearing external genitalia and urethral meatus; normal appearing vaginal mucosa and cervix.  No abnormal discharge noted.  Pap smear obtained.  Contact bleeding present .Normal uterine size, no other palpable masses, no uterine or adnexal tenderness.  .   Assessment and Plan:    There are no diagnoses linked to this encounter.  Pap: Will follow up results of pap smear and manage accordingly. Mammogram : n/a Labs: HIV/Hep c Refills: OCP/ U/s ordered for pelvic pain  Referral: none  Routine preventative health maintenance measures emphasized. Please refer to After Visit Summary for other counseling recommendations.      Philip Aspen, CNM [facility]

## 2021-02-27 NOTE — Patient Instructions (Signed)
Preventive Care 21-29 Years Old, Female °Preventive care refers to lifestyle choices and visits with your health care provider that can promote health and wellness. Preventive care visits are also called wellness exams. °What can I expect for my preventive care visit? °Counseling °During your preventive care visit, your health care provider may ask about your: °Medical history, including: °Past medical problems. °Family medical history. °Pregnancy history. °Current health, including: °Menstrual cycle. °Method of birth control. °Emotional well-being. °Home life and relationship well-being. °Sexual activity and sexual health. °Lifestyle, including: °Alcohol, nicotine or tobacco, and drug use. °Access to firearms. °Diet, exercise, and sleep habits. °Work and work environment. °Sunscreen use. °Safety issues such as seatbelt and bike helmet use. °Physical exam °Your health care provider may check your: °Height and weight. These may be used to calculate your BMI (body mass index). BMI is a measurement that tells if you are at a healthy weight. °Waist circumference. This measures the distance around your waistline. This measurement also tells if you are at a healthy weight and may help predict your risk of certain diseases, such as type 2 diabetes and high blood pressure. °Heart rate and blood pressure. °Body temperature. °Skin for abnormal spots. °What immunizations do I need? °Vaccines are usually given at various ages, according to a schedule. Your health care provider will recommend vaccines for you based on your age, medical history, and lifestyle or other factors, such as travel or where you work. °What tests do I need? °Screening °Your health care provider may recommend screening tests for certain conditions. This may include: °Pelvic exam and Pap test. °Lipid and cholesterol levels. °Diabetes screening. This is done by checking your blood sugar (glucose) after you have not eaten for a while (fasting). °Hepatitis B  test. °Hepatitis C test. °HIV (human immunodeficiency virus) test. °STI (sexually transmitted infection) testing, if you are at risk. °BRCA-related cancer screening. This may be done if you have a family history of breast, ovarian, tubal, or peritoneal cancers. °Talk with your health care provider about your test results, treatment options, and if necessary, the need for more tests. °Follow these instructions at home: °Eating and drinking ° °Eat a healthy diet that includes fresh fruits and vegetables, whole grains, lean protein, and low-fat dairy products. °Take vitamin and mineral supplements as recommended by your health care provider. °Do not drink alcohol if: °Your health care provider tells you not to drink. °You are pregnant, may be pregnant, or are planning to become pregnant. °If you drink alcohol: °Limit how much you have to 0-1 drink a day. °Know how much alcohol is in your drink. In the [place] one drink equals one 12 oz bottle of beer (355 mL), one 5 oz glass of wine (148 mL), or one 1½ oz glass of hard liquor (44 mL). °Lifestyle °Brush your teeth every morning and night with fluoride toothpaste. Floss one time each day. °Exercise for at least 30 minutes 5 or more days each week. °Do not use any products that contain nicotine or tobacco. These products include cigarettes, chewing tobacco, and vaping devices, such as e-cigarettes. If you need help quitting, ask your health care provider. °Do not use drugs. °If you are sexually active, practice safe sex. Use a condom or other form of protection to prevent STIs. °If you do not wish to become pregnant, use a form of birth control. If you plan to become pregnant, see your health care provider for a prepregnancy visit. °Find healthy ways to manage stress, such as: °Meditation, yoga,   or listening to music. °Journaling. °Talking to a trusted person. °Spending time with friends and family. °Minimize exposure to UV radiation to reduce your risk of skin  cancer. °Safety °Always wear your seat belt while driving or riding in a vehicle. °Do not drive: °If you have been drinking alcohol. Do not ride with someone who has been drinking. °If you have been using any mind-altering substances or drugs. °While texting. °When you are tired or distracted. °Wear a helmet and other protective equipment during sports activities. °If you have firearms in your house, make sure you follow all gun safety procedures. °Seek help if you have been physically or sexually abused. °What's next? °Go to your health care provider once a year for an annual wellness visit. °Ask your health care provider how often you should have your eyes and teeth checked. °Stay up to date on all vaccines. °This information is not intended to replace advice given to you by your health care provider. Make sure you discuss any questions you have with your health care provider. °Document Revised: 06/29/2020 Document Reviewed: 06/29/2020 °Elsevier Patient Education © 2022 [employer] ° °

## 2021-02-28 ENCOUNTER — Ambulatory Visit (INDEPENDENT_AMBULATORY_CARE_PROVIDER_SITE_OTHER): Payer: BC Managed Care – PPO

## 2021-02-28 DIAGNOSIS — R102 Pelvic and perineal pain: Secondary | ICD-10-CM | POA: Diagnosis not present

## 2021-02-28 LAB — HIV ANTIBODY (ROUTINE TESTING W REFLEX): HIV Screen 4th Generation wRfx: NONREACTIVE

## 2021-02-28 LAB — HEPATITIS C ANTIBODY: Hep C Virus Ab: NONREACTIVE

## 2021-03-02 LAB — CYTOLOGY - PAP: Diagnosis: NEGATIVE

## 2021-03-06 ENCOUNTER — Encounter: Payer: Self-pay | Admitting: Certified Nurse Midwife

## 2021-03-29 ENCOUNTER — Other Ambulatory Visit (HOSPITAL_BASED_OUTPATIENT_CLINIC_OR_DEPARTMENT_OTHER): Payer: Self-pay

## 2021-07-10 ENCOUNTER — Other Ambulatory Visit (HOSPITAL_BASED_OUTPATIENT_CLINIC_OR_DEPARTMENT_OTHER): Payer: Self-pay

## 2021-07-25 ENCOUNTER — Telehealth: Payer: Self-pay | Admitting: Obstetrics and Gynecology

## 2021-07-25 NOTE — Telephone Encounter (Signed)
Patient called and states that she is on her last BC pill . States she was told to skip the last week on all BC packs to prevent having a cycle. Patient is now on the last pack skipping the last week and trying to have pharmacy refill RX but pharmacy is saying she is not quite due for another refill. Please advise.

## 2021-07-26 MED ORDER — ALYACEN 1/35 1-35 MG-MCG PO TABS: ORAL_TABLET | ORAL | 3 refills | Status: DC

## 2021-07-26 NOTE — Telephone Encounter (Signed)
FYI: This came to Dr. Marcelline Mates. She told me to resend the script for 112 tablets with 3 refills.

## 2022-04-19 ENCOUNTER — Other Ambulatory Visit: Payer: Self-pay | Admitting: Certified Nurse Midwife

## 2022-06-27 ENCOUNTER — Other Ambulatory Visit (HOSPITAL_COMMUNITY)
Admission: RE | Admit: 2022-06-27 | Discharge: 2022-06-27 | Disposition: A | Discharge status: Home / Self Care | Payer: BC Managed Care – PPO | Source: Ambulatory Visit | Attending: Certified Nurse Midwife | Admitting: Certified Nurse Midwife

## 2022-06-27 ENCOUNTER — Ambulatory Visit (INDEPENDENT_AMBULATORY_CARE_PROVIDER_SITE_OTHER): Payer: BC Managed Care – PPO | Admitting: Certified Nurse Midwife

## 2022-06-27 ENCOUNTER — Encounter: Payer: Self-pay | Admitting: Certified Nurse Midwife

## 2022-06-27 VITALS — BP 116/81 | HR 82 | Resp 15 | Ht 68.0 in | Wt 186.1 lb

## 2022-06-27 DIAGNOSIS — R102 Pelvic and perineal pain: Secondary | ICD-10-CM

## 2022-06-27 DIAGNOSIS — Z01419 Encounter for gynecological examination (general) (routine) without abnormal findings: Secondary | ICD-10-CM

## 2022-06-27 DIAGNOSIS — Z13220 Encounter for screening for lipoid disorders: Secondary | ICD-10-CM

## 2022-06-27 MED ORDER — ALYACEN 1/35 1-35 MG-MCG PO TABS: ORAL_TABLET | ORAL | 3 refills | Status: DC

## 2022-06-27 NOTE — Patient Instructions (Signed)

## 2022-06-27 NOTE — Progress Notes (Signed)
GYNECOLOGY ANNUAL PREVENTATIVE CARE ENCOUNTER NOTE  History:     Sonia Webb is a 32 y.o. No obstetric history on file. female here for a routine annual gynecologic exam.  Current complaints: vaginal pain , random , shooting, makes it difficult to walk.   Denies abnormal vaginal bleeding, discharge, pelvic pain, problems with intercourse or other gynecologic concerns.     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 Denies smoking/vaping & drug use.   Gynecologic History No LMP recorded. (Menstrual status: Oral contraceptives). Contraception: OCP (estrogen/progesterone) Last Pap: 02/27/2021. Results were: normal Last mammogram: n/a    Upstream - 06/27/22 0820       Pregnancy Intention Screening   Does the patient want to become pregnant in the next year? No    Does the patient's partner want to become pregnant in the next year? No    Would the patient like to discuss contraceptive options today? No      Contraception Wrap Up   Current Method Abstinence    End Method Abstinence            The pregnancy intention screening data noted above was reviewed. Potential methods of contraception were discussed. The patient elected to proceed with Abstinence.   Obstetric History OB History  No obstetric history on file.    Past Medical History:  Diagnosis Date   Episodic tension-type headache, not intractable 06/09/2014   Family history of diabetes mellitus (DM) 07/22/2018   Formatting of this note might be different from the original. Mother   Gastroesophageal reflux disease without esophagitis 07/22/2018   Iron deficiency anemia due to chronic blood loss 11/27/2012   Pernicious anemia 07/24/2019   Formatting of this note might be different from the original. 153, 2020   Type 2B von Willebrand's disease ([facility]) 07/22/2018     Past Surgical History:  Procedure Laterality Date   LAPAROSCOPIC OVARIAN CYSTECTOMY Bilateral 05/27/2020   Procedure: LAPAROSCOPIC OVARIAN CYSTECTOMY;  Surgeon: Suzy Bouchard, MD;  Location: [facility] ORS;  Service: Gynecology;  Laterality: Bilateral;   TONSILLECTOMY     WISDOM TOOTH EXTRACTION  2010    Current Outpatient Medications on File Prior to Visit  Medication Sig Dispense Refill   acetaminophen (TYLENOL) 500 MG tablet Take 1,000 mg by mouth every 6 (six) hours as needed for moderate pain.     desmopressin (DDAVP NASAL) 0.01 % solution One spray in each nostril every 24-48 hours as needed for bleeding and as directed 60-90 minutes prior to procedures. Max 2 doses in one week. 5 mL 1   desmopressin (STIMATE) 1.5 MG/ML SOLN Place 1 spray into both nostrils once daily as needed One spray in each nostril every 24-48 hours prn bleeding and as directed 60-90 minutes prior to procedures. Max 2 doses in one week. 2.5 mL 1   ferrous gluconate (FERGON) 324 MG tablet Take 1 tablet (324 mg total) by mouth daily with breakfast 90 tablet 3   ferrous sulfate 325 (65 FE) MG tablet Take 650 mg by mouth daily.     gabapentin (NEURONTIN) 300 MG capsule Take 1 capsule (300 mg total) by mouth nightly for 10 days 10 capsule 11   norethindrone-ethinyl estradiol 1/35 (ALAYCEN 1/35) tablet Take 1 tablet by mouth daily continuously for 2 packs 112 tablet 3   pantoprazole (PROTONIX) 40 MG tablet Take 40  mg by mouth daily.      polyethylene glycol powder (GLYCOLAX/MIRALAX) 17 GM/SCOOP powder Take 17 g by mouth daily as needed for moderate constipation.     tranexamic acid (LYSTEDA) 650 MG TABS tablet Take 650 mg by mouth every 8 (eight) hours as needed (bleeding).     Current Facility-Administered Medications on File Prior to Visit  Medication Dose Route Frequency Provider Last Rate Last Admin   betamethasone acetate-betamethasone sodium phosphate (CELESTONE) injection 3 mg  3 mg Intramuscular Once  Gala Lewandowsky M, DPM       betamethasone acetate-betamethasone sodium phosphate (CELESTONE) injection 3 mg  3 mg Intramuscular Once Felecia Shelling, DPM        Allergies  Allergen Reactions   Lamotrigine Rash   Penicillins Anaphylaxis   Aspirin Other (See Comments)    ASA contraindicated with bleeding disorder   Nsaids Other (See Comments)    NSAIDS contraindicated with bleeding disorder.   Divalproex Sodium Other (See Comments)    shakes   Ibuprofen Other (See Comments)    Von Willibrand   Valproic Acid Other (See Comments)    Pt has the shakes   Cephalosporins Rash    Social History:  reports that she has never smoked. She has never used smokeless tobacco. She reports that she does not drink alcohol and does not use drugs.  Family History  Problem Relation Age of Onset   Diabetes Mother    Hypertension Mother    Prostate cancer Father    Hypertension Father    Bone cancer Maternal Grandfather     The following portions of the patient's history were reviewed and updated as appropriate: allergies, current medications, past family history, past medical history, past social history, past surgical history and problem list.  Review of Systems Pertinent items noted in HPI and remainder of comprehensive ROS otherwise negative.  Physical Exam:  BP 116/81   Pulse 82   Resp 15   Ht 5\' 8"  (1.727 m)   Wt 186 lb 1.6 oz (84.4 kg)   BMI 28.30 kg/m  CONSTITUTIONAL: Well-developed, well-nourished female in no acute distress.  HENT:  Normocephalic, atraumatic, External right and left ear normal. Oropharynx is clear and moist EYES: Conjunctivae and EOM are normal. Pupils are equal, round, and reactive to light. No scleral icterus.  NECK: Normal range of motion, supple, no masses.  Normal thyroid.  SKIN: Skin is warm and dry. No rash noted. Not diaphoretic. No erythema. No pallor. MUSCULOSKELETAL: Normal range of motion. No tenderness.  No cyanosis, clubbing, or edema.  2+ distal  pulses. NEUROLOGIC: Alert and oriented to person, place, and time. Normal reflexes, muscle tone coordination.  PSYCHIATRIC: Normal mood and affect. Normal behavior. Normal judgment and thought content. CARDIOVASCULAR: Normal heart rate noted, regular rhythm RESPIRATORY: Clear to auscultation bilaterally. Effort and breath sounds normal, no problems with respiration noted. BREASTS: Symmetric in size. No masses, tenderness, skin changes, nipple drainage, or lymphadenopathy bilaterally.  ABDOMEN: Soft, no distention noted.  No tenderness, rebound or guarding.  PELVIC: Normal appearing external genitalia and urethral meatus; normal appearing vaginal mucosa and cervix.  No abnormal discharge noted.  Pap smear not due, swab collected.  Normal uterine size, no other palpable masses, no uterine or adnexal tenderness.  .   Assessment and Plan:    1. Women's annual routine gynecological examination  Pap: not due until 2026 Mammogram : n/a  Labs: lipid /vaginal swab Refills: ocp  Referral: none  Routine preventative health maintenance measures emphasized.  Please refer to After Visit Summary for other counseling recommendations.      Doreene Burke, CNM [facility]  [facility],  [facility]

## 2022-06-28 ENCOUNTER — Encounter: Payer: Self-pay | Admitting: Certified Nurse Midwife

## 2022-06-28 ENCOUNTER — Other Ambulatory Visit: Payer: Self-pay | Admitting: Certified Nurse Midwife

## 2022-06-28 LAB — CERVICOVAGINAL ANCILLARY ONLY
Bacterial Vaginitis (gardnerella): NEGATIVE
Candida Glabrata: POSITIVE — AB
Candida Vaginitis: NEGATIVE
Chlamydia: NEGATIVE
Comment: NEGATIVE
Comment: NEGATIVE
Comment: NEGATIVE
Comment: NEGATIVE
Comment: NEGATIVE
Comment: NORMAL
Neisseria Gonorrhea: NEGATIVE
Trichomonas: NEGATIVE

## 2022-06-28 LAB — LIPID PANEL
Chol/HDL Ratio: 4.1 ratio (ref 0.0–4.4)
Cholesterol, Total: 214 mg/dL — ABNORMAL HIGH (ref 100–199)
HDL: 52 mg/dL (ref >39)
LDL Chol Calc (NIH): 144 mg/dL — ABNORMAL HIGH (ref 0–99)
Triglycerides: 101 mg/dL (ref 0–149)
VLDL Cholesterol Cal: 18 mg/dL (ref 5–40)

## 2022-06-28 MED ORDER — FLUCONAZOLE 150 MG PO TABS: 150.0000 mg | ORAL_TABLET | Freq: Once | ORAL | 1 refills | Status: AC

## 2022-07-05 ENCOUNTER — Other Ambulatory Visit (HOSPITAL_BASED_OUTPATIENT_CLINIC_OR_DEPARTMENT_OTHER): Payer: Self-pay

## 2022-08-29 ENCOUNTER — Encounter: Payer: Self-pay | Admitting: Certified Nurse Midwife

## 2022-08-31 ENCOUNTER — Other Ambulatory Visit: Payer: Self-pay | Admitting: Certified Nurse Midwife

## 2022-08-31 MED ORDER — ALYACEN 1/35 1-35 MG-MCG PO TABS: ORAL_TABLET | ORAL | 0 refills | Status: DC

## 2022-11-04 ENCOUNTER — Other Ambulatory Visit: Payer: Self-pay | Admitting: Certified Nurse Midwife

## 2022-11-05 MED ORDER — ALYACEN 1/35 1-35 MG-MCG PO TABS: ORAL_TABLET | ORAL | 0 refills | Status: AC

## 2023-03-25 ENCOUNTER — Other Ambulatory Visit (HOSPITAL_BASED_OUTPATIENT_CLINIC_OR_DEPARTMENT_OTHER): Payer: Self-pay

## 2024-01-22 ENCOUNTER — Encounter: Payer: Self-pay | Admitting: Oncology

## 2024-01-22 ENCOUNTER — Other Ambulatory Visit: Payer: Self-pay | Admitting: Family Medicine

## 2024-01-22 DIAGNOSIS — R519 Headache, unspecified: Secondary | ICD-10-CM

## 2024-01-22 DIAGNOSIS — R200 Anesthesia of skin: Secondary | ICD-10-CM

## 2024-01-27 ENCOUNTER — Ambulatory Visit
Admission: RE | Admit: 2024-01-27 | Discharge: 2024-01-27 | Disposition: A | Discharge status: Home / Self Care | Source: Ambulatory Visit | Attending: Family Medicine | Admitting: Family Medicine

## 2024-01-27 DIAGNOSIS — R202 Paresthesia of skin: Secondary | ICD-10-CM | POA: Insufficient documentation

## 2024-01-27 DIAGNOSIS — R519 Headache, unspecified: Secondary | ICD-10-CM | POA: Diagnosis present

## 2024-01-27 DIAGNOSIS — R200 Anesthesia of skin: Secondary | ICD-10-CM | POA: Diagnosis present

## 2024-02-03 ENCOUNTER — Other Ambulatory Visit
Admission: RE | Admit: 2024-02-03 | Discharge: 2024-02-03 | Disposition: A | Discharge status: Home / Self Care | Source: Ambulatory Visit | Attending: Neurology | Admitting: Neurology

## 2024-02-03 DIAGNOSIS — G379 Demyelinating disease of central nervous system, unspecified: Secondary | ICD-10-CM | POA: Insufficient documentation

## 2024-02-03 LAB — APTT: aPTT: 36 s (ref 24–36)

## 2024-02-04 ENCOUNTER — Other Ambulatory Visit: Payer: Self-pay | Admitting: Neurology

## 2024-02-04 DIAGNOSIS — G379 Demyelinating disease of central nervous system, unspecified: Secondary | ICD-10-CM

## 2024-02-06 ENCOUNTER — Ambulatory Visit
Admission: RE | Admit: 2024-02-06 | Discharge: 2024-02-06 | Disposition: A | Discharge status: Home / Self Care | Source: Ambulatory Visit | Attending: Neurology | Admitting: Neurology

## 2024-02-06 DIAGNOSIS — G379 Demyelinating disease of central nervous system, unspecified: Secondary | ICD-10-CM

## 2024-02-06 MED ORDER — GADOBUTROL 1 MMOL/ML IV SOLN
7.5000 mL | Freq: Once | INTRAVENOUS | Status: AC | PRN
  Administered 2024-02-06: 7.5 mL via INTRAVENOUS
